# Patient Record
Sex: Female | Born: 1975
Health system: Southern US, Community
[De-identification: ages and names within clinical notes are randomized; demographics above are authoritative.]

## PROBLEM LIST (undated history)

## (undated) DIAGNOSIS — N6019 Diffuse cystic mastopathy of unspecified breast: Secondary | ICD-10-CM

## (undated) DIAGNOSIS — O039 Complete or unspecified spontaneous abortion without complication: Secondary | ICD-10-CM

## (undated) DIAGNOSIS — R569 Unspecified convulsions: Secondary | ICD-10-CM

## (undated) DIAGNOSIS — E785 Hyperlipidemia, unspecified: Secondary | ICD-10-CM

## (undated) DIAGNOSIS — T7840XA Allergy, unspecified, initial encounter: Secondary | ICD-10-CM

## (undated) DIAGNOSIS — R413 Other amnesia: Secondary | ICD-10-CM

## (undated) HISTORY — PX: KNEE ARTHROSCOPY: SUR90

## (undated) HISTORY — DX: Diffuse cystic mastopathy of unspecified breast: N60.19

## (undated) HISTORY — DX: Unspecified convulsions: R56.9

## (undated) HISTORY — DX: Complete or unspecified spontaneous abortion without complication: O03.9

## (undated) HISTORY — DX: Hyperlipidemia, unspecified: E78.5

## (undated) HISTORY — DX: Other amnesia: R41.3

## (undated) HISTORY — DX: Allergy, unspecified, initial encounter: T78.40XA

---

## 1998-05-14 ENCOUNTER — Other Ambulatory Visit: Admission: RE | Admit: 1998-05-14 | Discharge: 1998-05-14 | Payer: Self-pay | Admitting: *Deleted

## 1999-05-22 ENCOUNTER — Other Ambulatory Visit: Admission: RE | Admit: 1999-05-22 | Discharge: 1999-05-22 | Payer: Self-pay | Admitting: *Deleted

## 2000-06-17 ENCOUNTER — Other Ambulatory Visit: Admission: RE | Admit: 2000-06-17 | Discharge: 2000-06-17 | Payer: Self-pay | Admitting: Obstetrics and Gynecology

## 2001-03-13 ENCOUNTER — Inpatient Hospital Stay (HOSPITAL_COMMUNITY): Admission: AD | Admit: 2001-03-13 | Discharge: 2001-03-16 | Payer: Self-pay | Admitting: Obstetrics and Gynecology

## 2001-04-26 ENCOUNTER — Other Ambulatory Visit: Admission: RE | Admit: 2001-04-26 | Discharge: 2001-04-26 | Payer: Self-pay | Admitting: Obstetrics and Gynecology

## 2002-06-13 ENCOUNTER — Other Ambulatory Visit: Admission: RE | Admit: 2002-06-13 | Discharge: 2002-06-13 | Payer: Self-pay | Admitting: Obstetrics and Gynecology

## 2003-08-06 ENCOUNTER — Other Ambulatory Visit: Admission: RE | Admit: 2003-08-06 | Discharge: 2003-08-06 | Payer: Self-pay | Admitting: Obstetrics and Gynecology

## 2004-02-10 DIAGNOSIS — O039 Complete or unspecified spontaneous abortion without complication: Secondary | ICD-10-CM

## 2004-02-10 HISTORY — PX: DILATION AND CURETTAGE OF UTERUS: SHX78

## 2004-02-10 HISTORY — DX: Complete or unspecified spontaneous abortion without complication: O03.9

## 2004-07-17 ENCOUNTER — Other Ambulatory Visit: Admission: RE | Admit: 2004-07-17 | Discharge: 2004-07-17 | Payer: Self-pay | Admitting: Obstetrics and Gynecology

## 2004-07-22 ENCOUNTER — Encounter (INDEPENDENT_AMBULATORY_CARE_PROVIDER_SITE_OTHER): Payer: Self-pay | Admitting: *Deleted

## 2004-07-22 ENCOUNTER — Ambulatory Visit (HOSPITAL_COMMUNITY): Admission: RE | Admit: 2004-07-22 | Discharge: 2004-07-22 | Payer: Self-pay | Admitting: Obstetrics and Gynecology

## 2005-04-24 ENCOUNTER — Other Ambulatory Visit: Admission: RE | Admit: 2005-04-24 | Discharge: 2005-04-24 | Payer: Self-pay | Admitting: Obstetrics and Gynecology

## 2005-10-07 ENCOUNTER — Inpatient Hospital Stay (HOSPITAL_COMMUNITY): Admission: AD | Admit: 2005-10-07 | Discharge: 2005-10-07 | Payer: Self-pay | Admitting: Obstetrics and Gynecology

## 2005-10-27 ENCOUNTER — Inpatient Hospital Stay (HOSPITAL_COMMUNITY): Admission: AD | Admit: 2005-10-27 | Discharge: 2005-10-27 | Payer: Self-pay | Admitting: Obstetrics and Gynecology

## 2005-11-11 ENCOUNTER — Inpatient Hospital Stay (HOSPITAL_COMMUNITY): Admission: RE | Admit: 2005-11-11 | Discharge: 2005-11-13 | Payer: Self-pay | Admitting: Obstetrics and Gynecology

## 2006-08-04 ENCOUNTER — Encounter: Admission: RE | Admit: 2006-08-04 | Discharge: 2006-08-04 | Payer: Self-pay | Admitting: Internal Medicine

## 2008-04-18 ENCOUNTER — Ambulatory Visit: Payer: Self-pay

## 2010-06-27 ENCOUNTER — Other Ambulatory Visit (HOSPITAL_COMMUNITY): Payer: Self-pay | Admitting: Internal Medicine

## 2010-06-27 ENCOUNTER — Ambulatory Visit (HOSPITAL_COMMUNITY)
Admission: RE | Admit: 2010-06-27 | Discharge: 2010-06-27 | Disposition: A | Discharge status: Home / Self Care | Payer: BC Managed Care – PPO | Source: Ambulatory Visit | Attending: Internal Medicine | Admitting: Internal Medicine

## 2010-06-27 DIAGNOSIS — R0989 Other specified symptoms and signs involving the circulatory and respiratory systems: Secondary | ICD-10-CM | POA: Insufficient documentation

## 2010-06-27 DIAGNOSIS — R059 Cough, unspecified: Secondary | ICD-10-CM

## 2010-06-27 DIAGNOSIS — R05 Cough: Secondary | ICD-10-CM

## 2010-06-27 DIAGNOSIS — R52 Pain, unspecified: Secondary | ICD-10-CM

## 2010-06-27 NOTE — Op Note (Signed)
NAMEVENA, BASSINGER              ACCOUNT NO.:  [id number]   MEDICAL RECORD NO.:  [id number]          PATIENT TYPE:  INP   LOCATION:  9102                          FACILITY:  [facility]   PHYSICIAN:  Guy Sandifer. Henderson Cloud, M.D. DATE OF BIRTH:  February 11, 1975   DATE OF PROCEDURE:  11/11/2005  DATE OF DISCHARGE:                                 OPERATIVE REPORT   PROCEDURE:  Vacuum extraction.   INDICATIONS AND CONSENT:  This patient is a 35 year old married white female  G4, P1 with an EDC of 11/20/2005.  She is admitted for size greater than  dates, for induction with a cervix.  She progresses to complete and pushing.  Been pushing for 1-1/2 to 2 hours.  Vertex is +3 and ROT.  She is exhausted.  Requests help.  Options are reviewed.  Vacuum extractor is reviewed: 1 in  40,000 risk of severe morbidity; mortality is reviewed.  All questions were  answered with the patient and husband.  The patient is prepped.  Bladder is  straight catheterized.   The kiwi vacuum extractor is placed.  There is 1 pop-off secondary to a lot  of curly hair on the baby's head.  Over 2-3 contractions with easy pulls,  the vertex delivers.  The remainder of the baby delivers without difficulty.  Viable female infant, Apgars of 9 and 9 at one and five minutes respectively  are obtained.  Cord pH and birth weight is pending at the time of dictation.  Placenta is delivered intact.  Estimated blood loss is 600 mL.  Cervix and  vagina without laceration.  Second-degree midline episiotomy is repaired in  standard fashion.  The patient and infant are stable in labor and delivery  room.      Guy Sandifer Henderson Cloud, M.D.  Electronically Signed     JET/MEDQ  D:  11/11/2005  T:  11/13/2005  Job:  [id number]

## 2010-06-27 NOTE — Op Note (Signed)
NAMEMARJI, KUEHNEL              ACCOUNT NO.:  [id number]   MEDICAL RECORD NO.:  [id number]          PATIENT TYPE:  AMB   LOCATION:  SDC                           FACILITY:  [facility]   PHYSICIAN:  Guy Sandifer. Henderson Cloud, M.D. DATE OF BIRTH:  04/12/1975   DATE OF PROCEDURE:  07/22/2004  DATE OF DISCHARGE:                                 OPERATIVE REPORT   PREOPERATIVE DIAGNOSIS:  Spontaneous abortion.   POSTOPERATIVE DIAGNOSIS:  Spontaneous abortion.   PROCEDURE:  1.  Dilatation and evacuation.  2.  Xylocaine 1% paracervical block.   SURGEON:  Guy Sandifer. Henderson Cloud, M.D.   ANESTHESIA:  MAC.   SPECIMENS:  Products of conception.   INDICATIONS AND CONSENT:  This patient is a 35 year old married white  female, G3, P1, with a spontaneous miscarriage at 7-2/7 weeks' crown-rump  length as documented on ultrasound.  Details are dictated on the history and  physical.  Dilatation and evacuation have been discussed with the patient  preoperatively.  Potential risks and complications are discussed  preoperatively including, but not limited to, infection, uterine  perforation, bowel, bladder or ureteral damage, bleeding requiring  transfusion of blood products and possible transfusions, HIV and hepatitis  acquisition, DVT, PE and pneumonia, intrauterine synechia, secondary  infertility and hysterectomy.  All questions have been answered and consent  is signed and on the chart.   PROCEDURE:  The patient is taken to the operating room where she is  identified, placed in the dorsal supine position and given intravenous  sedation.  She is then placed in the dorsal lithotomy position, where she is  gently prepped, bladder is straight-catheterized and draped in a sterile  fashion.  A bivalved speculum is placed in the vagina and the anterior  cervical lip is injected with 1% Xylocaine and grasped with a single-tooth  tenaculum.  A paracervical block is placed at the 2, 4, 5, 7, 8 and 10  o'clock positions  with approximately 20 mL of 1% plain Xylocaine.  Cervix is  then gently progressively dilated to a 29 dilator.  A #7 curved suction  curette is then placed and suction curettage is carried out for products of  conception.  Ten units of Pitocin are added to the remaining 500 mL of IV  fluids after the initial pass of the suction curet.  Alternating sharp and  suction curettage are carried out until the cavity is clean.  The patient is  given intravenous antibiotics as well.  Good hemostasis is noted.  All  instruments are removed.  The patient is taken to the recovery room in  stable condition.     JET/MEDQ  D:  07/22/2004  T:  07/22/2004  Job:  [id number]

## 2010-06-27 NOTE — H&P (Signed)
Angela Smith, Angela Smith              ACCOUNT NO.:  [id number]   MEDICAL RECORD NO.:  [id number]          PATIENT TYPE:  AMB   LOCATION:  SDC                           FACILITY:  [facility]   PHYSICIAN:  Guy Sandifer. Henderson Cloud, M.D. DATE OF BIRTH:  1975/08/13   DATE OF ADMISSION:  DATE OF DISCHARGE:                                HISTORY & PHYSICAL   CHIEF COMPLAINT:  Miscarriage.   HISTORY OF PRESENT ILLNESS:  This patient is a 35 year old married white  female, G3, P1, who had a spontaneous miscarriage managed expectantly  earlier this year.  She had a documented quantitative hCG that was negative  prior to this conception.  Her last menstrual period was May 08, 2004.  Ultrasound in my office on July 17, 2004 revealed an intrauterine pregnancy  with a crown-rump length consistent with 7 weeks and 2 days.  No heart  activity was noted on prolonged examination.  There was subchorionic  hemorrhage noted.  The cervix is closed, thick, and high.  Blood type is O  positive.  After discussion of the options, she is being admitted for  dilatation and evacuation.   PAST MEDICAL HISTORY:  1.  History of hemorrhoids.  2.  History of anemia.  3.  Seizure disorder as a child.   PAST SURGICAL HISTORY:  ACL repair in 2000.   OBSTETRIC HISTORY:  1.  Vaginal delivery x1.  2.  Miscarriage x1.   MEDICATIONS:  Prenatal vitamins.   ALLERGIES:  No known drug allergies.   SOCIAL HISTORY:  Denies tobacco, alcohol, or drug abuse.   FAMILY HISTORY:  Heart disease in maternal grandfather.   REVIEW OF SYSTEMS:  NEUROLOGIC:  Denies headache.  CARDIAC:  Denies chest  pain.  PULMONARY:  Denies shortness of breath.  GI:  Denies recent changes  in bowel habits.   PHYSICAL EXAMINATION:  VITAL SIGNS:  Height 5 feet, 8.5 inches.  Weight 163  pounds.  Blood pressure 114/70.  HEENT:  Without thyromegaly.  LUNGS:  Clear to auscultation.  HEART:  Regular rate and rhythm.  BACK:  Without CVA tenderness.  BREASTS:   Without masses, retractions, or discharge.  ABDOMEN:  Soft, nontender, without masses.  PELVIC:  Vulva, vagina, cervix without lesions.  Uterus is about 6-8 weeks  in size.  Adnexa nontender without masses.  Cervix closed and thick.  EXTREMITIES:  Neurological exam grossly within normal limits.   LABORATORY DATA:  Blood type is O positive.   ASSESSMENT:  Spontaneous miscarriage.   PLAN:  Dilatation evacuation.       JET/MEDQ  D:  07/21/2004  T:  07/21/2004  Job:  [id number]

## 2011-08-26 ENCOUNTER — Other Ambulatory Visit: Payer: Self-pay | Admitting: Obstetrics and Gynecology

## 2011-08-26 DIAGNOSIS — N6320 Unspecified lump in the left breast, unspecified quadrant: Secondary | ICD-10-CM

## 2011-08-28 ENCOUNTER — Other Ambulatory Visit: Payer: BC Managed Care – PPO

## 2011-09-02 ENCOUNTER — Ambulatory Visit
Admission: RE | Admit: 2011-09-02 | Discharge: 2011-09-02 | Disposition: A | Discharge status: Home / Self Care | Payer: BC Managed Care – PPO | Source: Ambulatory Visit | Attending: Obstetrics and Gynecology | Admitting: Obstetrics and Gynecology

## 2011-09-02 ENCOUNTER — Other Ambulatory Visit: Payer: Self-pay | Admitting: Obstetrics and Gynecology

## 2011-09-02 DIAGNOSIS — N6320 Unspecified lump in the left breast, unspecified quadrant: Secondary | ICD-10-CM

## 2012-11-29 ENCOUNTER — Encounter: Payer: Self-pay | Admitting: Internal Medicine

## 2013-11-28 ENCOUNTER — Ambulatory Visit (INDEPENDENT_AMBULATORY_CARE_PROVIDER_SITE_OTHER): Payer: BC Managed Care – PPO | Admitting: Emergency Medicine

## 2013-11-28 ENCOUNTER — Encounter: Payer: Self-pay | Admitting: Emergency Medicine

## 2013-11-28 VITALS — BP 118/70 | HR 64 | Temp 98.0°F | Resp 16 | Ht 68.5 in | Wt 158.0 lb

## 2013-11-28 DIAGNOSIS — Z0000 Encounter for general adult medical examination without abnormal findings: Secondary | ICD-10-CM

## 2013-11-28 DIAGNOSIS — E559 Vitamin D deficiency, unspecified: Secondary | ICD-10-CM

## 2013-11-28 DIAGNOSIS — Z111 Encounter for screening for respiratory tuberculosis: Secondary | ICD-10-CM

## 2013-11-28 DIAGNOSIS — Z79899 Other long term (current) drug therapy: Secondary | ICD-10-CM

## 2013-11-28 LAB — BASIC METABOLIC PANEL WITH GFR
BUN: 11 mg/dL (ref 6–23)
CALCIUM: 9.6 mg/dL (ref 8.4–10.5)
CO2: 27 meq/L (ref 19–32)
CREATININE: 0.8 mg/dL (ref 0.50–1.10)
Chloride: 103 mEq/L (ref 96–112)
GFR, Est African American: >89 mL/min
GFR, Est Non African American: >89 mL/min
GLUCOSE: 82 mg/dL (ref 70–99)
Potassium: 4.5 mEq/L (ref 3.5–5.3)
Sodium: 139 mEq/L (ref 135–145)

## 2013-11-28 LAB — CBC WITH DIFFERENTIAL/PLATELET
Basophils Absolute: 0 10*3/uL (ref 0.0–0.1)
Basophils Relative: 0 % (ref 0–1)
EOS ABS: 0 10*3/uL (ref 0.0–0.7)
EOS PCT: 0 % (ref 0–5)
HCT: 38.8 % (ref 36.0–46.0)
HEMOGLOBIN: 13.7 g/dL (ref 12.0–15.0)
LYMPHS ABS: 2.2 10*3/uL (ref 0.7–4.0)
Lymphocytes Relative: 31 % (ref 12–46)
MCH: 32.9 pg (ref 26.0–34.0)
MCHC: 35.3 g/dL (ref 30.0–36.0)
MCV: 93.3 fL (ref 78.0–100.0)
MONOS PCT: 7 % (ref 3–12)
Monocytes Absolute: 0.5 10*3/uL (ref 0.1–1.0)
NEUTROS PCT: 62 % (ref 43–77)
Neutro Abs: 4.5 10*3/uL (ref 1.7–7.7)
Platelets: 314 10*3/uL (ref 150–400)
RBC: 4.16 MIL/uL (ref 3.87–5.11)
RDW: 13.1 % (ref 11.5–15.5)
WBC: 7.2 10*3/uL (ref 4.0–10.5)

## 2013-11-28 LAB — HEMOGLOBIN A1C
Hgb A1c MFr Bld: 5.3 % (ref <5.7)
Mean Plasma Glucose: 105 mg/dL (ref <117)

## 2013-11-28 LAB — HEPATIC FUNCTION PANEL
ALBUMIN: 4.6 g/dL (ref 3.5–5.2)
ALT: 17 U/L (ref 0–35)
AST: 22 U/L (ref 0–37)
Alkaline Phosphatase: 53 U/L (ref 39–117)
Bilirubin, Direct: 0.2 mg/dL (ref 0.0–0.3)
Indirect Bilirubin: 0.6 mg/dL (ref 0.2–1.2)
TOTAL PROTEIN: 7.1 g/dL (ref 6.0–8.3)
Total Bilirubin: 0.8 mg/dL (ref 0.2–1.2)

## 2013-11-28 LAB — LIPID PANEL
Cholesterol: 137 mg/dL (ref 0–200)
HDL: 59 mg/dL (ref >39)
LDL Cholesterol: 67 mg/dL (ref 0–99)
TRIGLYCERIDES: 57 mg/dL (ref <150)
Total CHOL/HDL Ratio: 2.3 Ratio
VLDL: 11 mg/dL (ref 0–40)

## 2013-11-28 LAB — TSH: TSH: 1.923 u[IU]/mL (ref 0.350–4.500)

## 2013-11-28 LAB — MAGNESIUM: MAGNESIUM: 1.9 mg/dL (ref 1.5–2.5)

## 2013-11-28 NOTE — Progress Notes (Signed)
Subjective:    Patient ID: Angela Smith, female    DOB: Jun 19, 1975, 38 y.o.   MRN: [id number]  HPI Comments: 38 yo CPE. She is doing well overall. She is on VITA PAK with fish oil/ D/ MVIT. Elliptical/ weights x 6 days / wk for exercise. She has been eating healthy. 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  She is in chiropractic for wrist inflammation. She notes + improvement with treatments and limited ROM exercises.     Medication List       This list is accurate as of: 11/28/13 11:59 PM.  Always use your most recent med list.               aspirin 81 MG tablet  Take 81 mg by mouth daily.     cholecalciferol 1000 UNITS tablet  Commonly known as:  VITAMIN D  Take 2,000 Units by mouth daily.     fish oil-omega-3 fatty acids 1000 MG capsule  Take 2 g by mouth daily.     MIRENA IU  by Intrauterine route. PLACED 2011       Allergies  Allergen Reactions  . Rhinocort [Budesonide] Other (See Comments)    NO RELIEF  . Wellbutrin [Bupropion] Other (See Comments)    SEIZURE RISK     Past Medical History  Diagnosis Date  . Allergy     ALLERGIC RHINITS  . Seizures     INFANTILE  . Hyperlipidemia     DIET/ HERBAL CONTROLED  . Fibrocystic breast disease   . Miscarriage 2006    TWO SEPERATE 4 MONTHS APART   Past Surgical History  Procedure Laterality Date  . Dilation and curettage of uterus  2006    MISCARRIAGE  . Knee arthroscopy Left    History  Substance Use Topics  . Smoking status: Never Smoker   . Smokeless tobacco: Not on file  . Alcohol Use: No   Family History  Problem Relation Age of Onset  . Depression Mother   . Hypertension Father   . Diabetes Maternal Grandmother   . Osteoporosis Maternal Grandmother   . Heart disease Maternal Grandfather   . Hyperlipidemia Maternal Grandfather   . Cancer Maternal Grandfather     PANCREATIC/ BONE  . Cancer Paternal Grandfather     THROAT     MAINTENANCE: Mammo:06/2011 WNL due @ 8 Pap/ Pelvic:5/ 2015 WNL [id number] Dentist:q 6 month  IMMUNIZATIONS: Tdap:2014 Influenza: declines  Patient Care Team: Unk Pinto, MD as PCP - General (Internal Medicine) Allena Katz, MD as Consulting Physician (Obstetrics and Gynecology) Wilburt Finlay, OD as Referring Physician (Optometry) Amy Martinique, MD as Consulting Physician (Dermatology) Bethann Goo, Burna Mortimer)  Review of Systems  Respiratory: Negative for shortness of breath.   Cardiovascular: Negative for chest pain.  Musculoskeletal: Positive for arthralgias.  All other systems reviewed and are negative.  BP 118/70  Pulse 64  Temp(Src) 98 F (36.7 C) (Temporal)  Resp 16  Ht 5' 8.5" (1.74 m)  Wt 158 lb (71.668 kg)  BMI 23.67 kg/m2      Objective:   Physical Exam  Nursing note and vitals reviewed. Constitutional: She is oriented to person, place, and time. She appears well-developed and well-nourished. No distress.  HENT:  Head: Normocephalic and atraumatic.  Right Ear: External ear normal.  Left Ear: External ear normal.  Nose: Nose normal.  Mouth/Throat: Oropharynx is clear and moist.  Eyes: Conjunctivae and EOM are normal. Pupils are equal,  round, and reactive to light. Right eye exhibits no discharge. Left eye exhibits no discharge. No scleral icterus.  Neck: Normal range of motion. Neck supple. No JVD present. No tracheal deviation present. No thyromegaly present.  Cardiovascular: Normal rate, regular rhythm, normal heart sounds and intact distal pulses.   Pulmonary/Chest: Effort normal and breath sounds normal.  Abdominal: Soft. Bowel sounds are normal. She exhibits no distension and no mass. There is no tenderness. There is no rebound and no guarding.  Musculoskeletal: Normal range of motion. She exhibits no edema and no tenderness.  Lymphadenopathy:    She has no cervical adenopathy.  Neurological: She is alert and oriented to person, place, and time. She  has normal reflexes. No cranial nerve deficit. She exhibits normal muscle tone. Coordination normal.  Skin: Skin is warm and dry. No rash noted. No erythema. No pallor.  Psychiatric: She has a normal mood and affect. Her behavior is normal. Judgment and thought content normal.      EKG NSCSPT WNL     Assessment & Plan:  1. CPE- Update screening labs/ History/ Immunizations/ Testing as needed. Advised healthy diet, QD exercise, increase H20 and continue RX/ Vitamins AD.  2. Wrist pain- Continue chiropractic, advise against push ups or exercise with extreme pressure on wrists. F/U if symptoms continue for X-ray. Wear wrist splint when sleeping or working.

## 2013-11-28 NOTE — Patient Instructions (Signed)

## 2013-11-29 LAB — VITAMIN D 25 HYDROXY (VIT D DEFICIENCY, FRACTURES): Vit D, 25-Hydroxy: 67 ng/mL (ref 30–89)

## 2013-11-29 LAB — URINALYSIS, MICROSCOPIC ONLY
BACTERIA UA: NONE SEEN
CRYSTALS: NONE SEEN
Casts: NONE SEEN
Squamous Epithelial / LPF: NONE SEEN

## 2013-11-29 LAB — URINALYSIS, ROUTINE W REFLEX MICROSCOPIC
Bilirubin Urine: NEGATIVE
Glucose, UA: NEGATIVE mg/dL
KETONES UR: NEGATIVE mg/dL
Leukocytes, UA: NEGATIVE
NITRITE: NEGATIVE
PROTEIN: NEGATIVE mg/dL
SPECIFIC GRAVITY, URINE: 1.005 (ref 1.005–1.030)
UROBILINOGEN UA: 0.2 mg/dL (ref 0.0–1.0)
pH: 7 (ref 5.0–8.0)

## 2013-11-29 LAB — INSULIN, FASTING: Insulin fasting, serum: 1.4 u[IU]/mL — ABNORMAL LOW (ref 2.0–19.6)

## 2013-11-30 ENCOUNTER — Encounter: Payer: Self-pay | Admitting: Emergency Medicine

## 2013-12-01 LAB — TB SKIN TEST: TB SKIN TEST: NEGATIVE

## 2013-12-14 ENCOUNTER — Other Ambulatory Visit: Payer: Self-pay | Admitting: Emergency Medicine

## 2013-12-14 ENCOUNTER — Other Ambulatory Visit: Payer: BC Managed Care – PPO

## 2013-12-14 DIAGNOSIS — R319 Hematuria, unspecified: Secondary | ICD-10-CM

## 2013-12-15 LAB — URINALYSIS, ROUTINE W REFLEX MICROSCOPIC
Bilirubin Urine: NEGATIVE
Glucose, UA: NEGATIVE mg/dL
Hgb urine dipstick: NEGATIVE
KETONES UR: NEGATIVE mg/dL
LEUKOCYTES UA: NEGATIVE
NITRITE: NEGATIVE
PH: 7 (ref 5.0–8.0)
Protein, ur: NEGATIVE mg/dL
UROBILINOGEN UA: 0.2 mg/dL (ref 0.0–1.0)

## 2014-04-05 ENCOUNTER — Other Ambulatory Visit: Payer: Self-pay | Admitting: Obstetrics and Gynecology

## 2014-04-06 LAB — CYTOLOGY - PAP

## 2014-12-03 ENCOUNTER — Encounter: Payer: Self-pay | Admitting: Emergency Medicine

## 2015-05-25 ENCOUNTER — Emergency Department (HOSPITAL_COMMUNITY)
Admission: EM | Admit: 2015-05-25 | Discharge: 2015-05-25 | Disposition: A | Discharge status: Home / Self Care | Payer: BLUE CROSS/BLUE SHIELD | Attending: Emergency Medicine | Admitting: Emergency Medicine

## 2015-05-25 ENCOUNTER — Emergency Department (HOSPITAL_COMMUNITY): Payer: BLUE CROSS/BLUE SHIELD

## 2015-05-25 ENCOUNTER — Encounter (HOSPITAL_COMMUNITY): Payer: Self-pay | Admitting: Emergency Medicine

## 2015-05-25 DIAGNOSIS — S61011A Laceration without foreign body of right thumb without damage to nail, initial encounter: Secondary | ICD-10-CM | POA: Insufficient documentation

## 2015-05-25 DIAGNOSIS — Y9389 Activity, other specified: Secondary | ICD-10-CM | POA: Insufficient documentation

## 2015-05-25 DIAGNOSIS — S65211A Laceration of superficial palmar arch of right hand, initial encounter: Secondary | ICD-10-CM | POA: Diagnosis present

## 2015-05-25 DIAGNOSIS — E785 Hyperlipidemia, unspecified: Secondary | ICD-10-CM | POA: Insufficient documentation

## 2015-05-25 DIAGNOSIS — Y929 Unspecified place or not applicable: Secondary | ICD-10-CM | POA: Diagnosis not present

## 2015-05-25 DIAGNOSIS — W228XXA Striking against or struck by other objects, initial encounter: Secondary | ICD-10-CM | POA: Insufficient documentation

## 2015-05-25 DIAGNOSIS — Y999 Unspecified external cause status: Secondary | ICD-10-CM | POA: Insufficient documentation

## 2015-05-25 DIAGNOSIS — S61411A Laceration without foreign body of right hand, initial encounter: Secondary | ICD-10-CM

## 2015-05-25 MED ORDER — LIDOCAINE HCL (PF) 1 % IJ SOLN
5.0000 mL | Freq: Once | INTRAMUSCULAR | Status: DC
Start: 2015-05-25 — End: 2015-05-25
  Filled 2015-05-25: qty 5

## 2015-05-25 NOTE — Discharge Instructions (Signed)
Return for any redness, drainage, increased pain or other problems.  HAVE A HAPPY EASTER!!!  Angela Smith you find all the eggs.

## 2015-05-25 NOTE — ED Provider Notes (Signed)
CSN: [id number]     Arrival date & time 05/25/15  1834 History   First MD Initiated Contact with Patient 05/25/15 1853     Chief Complaint  Patient presents with  . Extremity Laceration     (Consider location/radiation/quality/duration/timing/severity/associated sxs/prior Treatment) Patient is a 40 y.o. female presenting with skin laceration. The history is provided by the patient.  Laceration Location:  Finger Finger laceration location:  R thumb Length (cm):  2 Depth:  Through dermis Quality: straight   Bleeding: controlled   Time since incident:  2 hours Laceration mechanism:  Broken glass Pain details:    Severity:  Mild   Progression:  Improving Worsened by:  Nothing tried Tetanus status:  Up to date  Angela Smith is a 40 y.o. female who presents to the ED with a laceration to the right thumb. Patient states that she broke a plate and it cut her thumb approximately 2 hours prior to arrival to the ED. She is up to date on tetanus.   Past Medical History  Diagnosis Date  . Allergy     ALLERGIC RHINITS  . Seizures ([facility])     INFANTILE  . Hyperlipidemia     DIET/ HERBAL CONTROLED  . Fibrocystic breast disease   . Miscarriage 2006    TWO SEPERATE 4 MONTHS APART   Past Surgical History  Procedure Laterality Date  . Dilation and curettage of uterus  2006    MISCARRIAGE  . Knee arthroscopy Left    Family History  Problem Relation Age of Onset  . Depression Mother   . Hypertension Father   . Diabetes Maternal Grandmother   . Osteoporosis Maternal Grandmother   . Heart disease Maternal Grandfather   . Hyperlipidemia Maternal Grandfather   . Cancer Maternal Grandfather     PANCREATIC/ BONE  . Cancer Paternal Grandfather     THROAT   Social History  Substance Use Topics  . Smoking status: Never Smoker   . Smokeless tobacco: None  . Alcohol Use: No   OB History    No data available     Review of Systems Negative except as stated in HPI   Allergies    Rhinocort  Home Medications   Prior to Admission medications   Medication Sig Start Date End Date Taking? Authorizing Provider  aspirin 81 MG tablet Take 81 mg by mouth daily.    Historical Provider, MD  cholecalciferol (VITAMIN D) 1000 UNITS tablet Take 2,000 Units by mouth daily.    Historical Provider, MD  fish oil-omega-3 fatty acids 1000 MG capsule Take 2 g by mouth daily.    Historical Provider, MD  Levonorgestrel (MIRENA IU) by Intrauterine route. PLACED 2011    Historical Provider, MD   BP 119/87 mmHg  Pulse 70  Temp(Src) 98.2 F (36.8 C) (Oral)  Resp 15  Ht 5\' 9"  (1.753 m)  Wt 74.844 kg  BMI 24.36 kg/m2  SpO2 98% Physical Exam  Constitutional: She is oriented to person, place, and time. She appears well-developed and well-nourished. No distress.  HENT:  Head: Normocephalic and atraumatic.  Eyes: EOM are normal.  Neck: Neck supple.  Cardiovascular: Normal rate.   Pulmonary/Chest: Effort normal.  Musculoskeletal: Normal range of motion.       Right hand: She exhibits tenderness and laceration. She exhibits normal range of motion, normal capillary refill and no swelling. Normal sensation noted. Normal strength noted. She exhibits no thumb/finger opposition.       Hands: Superficial laceration to the dorsum  of the right hand and 2 cm laceration to the right thumb.   Neurological: She is alert and oriented to person, place, and time. No cranial nerve deficit.  Skin: Skin is warm and dry.  Psychiatric: She has a normal mood and affect. Her behavior is normal.  Nursing note and vitals reviewed.   ED Course  .Marland KitchenLaceration Repair Date/Time: 05/25/2015 8:19 PM Performed by: Ashley Murrain Authorized by: Ashley Murrain Consent: Verbal consent obtained. Risks and benefits: risks, benefits and alternatives were discussed Consent given by: patient Patient understanding: patient states understanding of the procedure being performed Imaging studies: imaging studies  available Required items: required blood products, implants, devices, and special equipment available Patient identity confirmed: verbally with patient Body area: upper extremity Location details: right thumb Laceration length: 2 cm Tendon involvement: none Nerve involvement: none Vascular damage: no Anesthesia: local infiltration Local anesthetic: lidocaine 1% without epinephrine Anesthetic total: 2 ml Patient sedated: no Preparation: Patient was prepped and draped in the usual sterile fashion. Irrigation solution: saline Irrigation method: syringe Amount of cleaning: standard Debridement: none Degree of undermining: none Skin closure: 5-0 Prolene Number of sutures: 4 Technique: simple Approximation: close Approximation difficulty: simple Dressing: pressure dressing Patient tolerance: Patient tolerated the procedure well with no immediate complications    Superficial Wound to dorsum of hand cleaned and closed with dermabond.   X-ray, wound care,  Dermabond to the superficial laceration to the dorsum of the hand  Sutures to the thumb.   MDM  40 y.o. female with laceration to the right hand stable for d/c without focal neuro deficits. Discussed with the patient x-ray findings and plan of care and all questioned fully answered. She will return if any problems arise. She will f/u for suture removal in 7 days.   Final diagnoses:  Laceration of thumb, right, initial encounter  Superficial laceration of hand, right, initial encounter       [facility], NP 05/25/15 2024  Forde Dandy, MD 05/25/15 [phone]

## 2015-05-25 NOTE — ED Notes (Signed)
PT stated she dropped a plate on a granite countertop and it broke and hit her tumb and top of right hand x2 hours ago. Bleeding controlled at this time.

## 2016-02-10 HISTORY — PX: APPENDECTOMY: SHX54

## 2016-07-16 LAB — HM PAP SMEAR

## 2016-08-09 ENCOUNTER — Observation Stay (HOSPITAL_COMMUNITY)
Admission: EM | Admit: 2016-08-09 | Discharge: 2016-08-11 | Disposition: A | Discharge status: Home / Self Care | Payer: BLUE CROSS/BLUE SHIELD | Attending: General Surgery | Admitting: General Surgery

## 2016-08-09 ENCOUNTER — Encounter (HOSPITAL_COMMUNITY): Payer: Self-pay | Admitting: Emergency Medicine

## 2016-08-09 ENCOUNTER — Emergency Department (HOSPITAL_COMMUNITY): Payer: BLUE CROSS/BLUE SHIELD

## 2016-08-09 DIAGNOSIS — Z8759 Personal history of other complications of pregnancy, childbirth and the puerperium: Secondary | ICD-10-CM | POA: Diagnosis not present

## 2016-08-09 DIAGNOSIS — R1084 Generalized abdominal pain: Secondary | ICD-10-CM

## 2016-08-09 DIAGNOSIS — K3532 Acute appendicitis with perforation and localized peritonitis, without abscess: Secondary | ICD-10-CM

## 2016-08-09 DIAGNOSIS — R109 Unspecified abdominal pain: Secondary | ICD-10-CM | POA: Diagnosis present

## 2016-08-09 DIAGNOSIS — R1110 Vomiting, unspecified: Secondary | ICD-10-CM | POA: Insufficient documentation

## 2016-08-09 DIAGNOSIS — K352 Acute appendicitis with generalized peritonitis, without abscess: Secondary | ICD-10-CM

## 2016-08-09 LAB — URINALYSIS, ROUTINE W REFLEX MICROSCOPIC
Bilirubin Urine: NEGATIVE
Glucose, UA: NEGATIVE mg/dL
KETONES UR: 20 mg/dL — AB
LEUKOCYTES UA: NEGATIVE
Nitrite: NEGATIVE
Protein, ur: 30 mg/dL — AB
Specific Gravity, Urine: >1.046 — ABNORMAL HIGH (ref 1.005–1.030)
pH: 5 (ref 5.0–8.0)

## 2016-08-09 LAB — COMPREHENSIVE METABOLIC PANEL
ALK PHOS: 77 U/L (ref 38–126)
ALT: 14 U/L (ref 14–54)
AST: 15 U/L (ref 15–41)
Albumin: 3.9 g/dL (ref 3.5–5.0)
Anion gap: 17 — ABNORMAL HIGH (ref 5–15)
BILIRUBIN TOTAL: 0.9 mg/dL (ref 0.3–1.2)
BUN: 42 mg/dL — AB (ref 6–20)
CALCIUM: 9.3 mg/dL (ref 8.9–10.3)
CO2: 24 mmol/L (ref 22–32)
CREATININE: 1.91 mg/dL — AB (ref 0.44–1.00)
Chloride: 95 mmol/L — ABNORMAL LOW (ref 101–111)
GFR calc Af Amer: 37 mL/min — ABNORMAL LOW (ref >60)
GFR, EST NON AFRICAN AMERICAN: 32 mL/min — AB (ref >60)
Glucose, Bld: 112 mg/dL — ABNORMAL HIGH (ref 65–99)
POTASSIUM: 3.1 mmol/L — AB (ref 3.5–5.1)
Sodium: 136 mmol/L (ref 135–145)
TOTAL PROTEIN: 8.5 g/dL — AB (ref 6.5–8.1)

## 2016-08-09 LAB — CBC WITH DIFFERENTIAL/PLATELET
BASOS ABS: 0 10*3/uL (ref 0.0–0.1)
Basophils Relative: 0 %
Eosinophils Absolute: 0 10*3/uL (ref 0.0–0.7)
Eosinophils Relative: 0 %
HEMATOCRIT: 39.5 % (ref 36.0–46.0)
HEMOGLOBIN: 13.4 g/dL (ref 12.0–15.0)
LYMPHS PCT: 3 %
Lymphs Abs: 0.6 10*3/uL — ABNORMAL LOW (ref 0.7–4.0)
MCH: 30.2 pg (ref 26.0–34.0)
MCHC: 33.9 g/dL (ref 30.0–36.0)
MCV: 89 fL (ref 78.0–100.0)
MONO ABS: 1.3 10*3/uL — AB (ref 0.1–1.0)
MONOS PCT: 6 %
NEUTROS ABS: 19.2 10*3/uL — AB (ref 1.7–7.7)
NEUTROS PCT: 91 %
Platelets: 355 10*3/uL (ref 150–400)
RBC: 4.44 MIL/uL (ref 3.87–5.11)
RDW: 14.8 % (ref 11.5–15.5)
WBC: 21.1 10*3/uL — ABNORMAL HIGH (ref 4.0–10.5)

## 2016-08-09 LAB — HCG, QUANTITATIVE, PREGNANCY

## 2016-08-09 LAB — LIPASE, BLOOD: LIPASE: 31 U/L (ref 11–51)

## 2016-08-09 MED ORDER — SODIUM CHLORIDE 0.9 % IV BOLUS (SEPSIS)
1000.0000 mL | Freq: Once | INTRAVENOUS | Status: AC
  Administered 2016-08-09: 1000 mL via INTRAVENOUS

## 2016-08-09 MED ORDER — ONDANSETRON 4 MG PO TBDP: 4.0000 mg | ORAL_TABLET | Freq: Four times a day (QID) | ORAL | Status: DC | PRN

## 2016-08-09 MED ORDER — ENOXAPARIN SODIUM 40 MG/0.4ML ~~LOC~~ SOLN
40.0000 mg | SUBCUTANEOUS | Status: DC
Start: 2016-08-09 — End: 2016-08-11
  Filled 2016-08-09: qty 0.4

## 2016-08-09 MED ORDER — KCL IN DEXTROSE-NACL 40-5-0.45 MEQ/L-%-% IV SOLN
INTRAVENOUS | Status: DC
  Administered 2016-08-09 – 2016-08-10 (×3): via INTRAVENOUS

## 2016-08-09 MED ORDER — SODIUM CHLORIDE 0.9 % IV BOLUS (SEPSIS)
500.0000 mL | Freq: Once | INTRAVENOUS | Status: AC
  Administered 2016-08-09: 500 mL via INTRAVENOUS

## 2016-08-09 MED ORDER — OXYCODONE-ACETAMINOPHEN 5-325 MG PO TABS
1.0000 | ORAL_TABLET | ORAL | Status: DC | PRN
  Administered 2016-08-10: 2 via ORAL
  Administered 2016-08-10: 1 via ORAL
  Administered 2016-08-11 (×3): 2 via ORAL
  Filled 2016-08-09: qty 2
  Filled 2016-08-09: qty 1
  Filled 2016-08-09 (×3): qty 2

## 2016-08-09 MED ORDER — ACETAMINOPHEN 325 MG PO TABS
650.0000 mg | ORAL_TABLET | Freq: Four times a day (QID) | ORAL | Status: DC | PRN
Start: 2016-08-09 — End: 2016-08-11
  Administered 2016-08-10: 650 mg via ORAL
  Filled 2016-08-09: qty 2

## 2016-08-09 MED ORDER — SIMETHICONE 80 MG PO CHEW: 40.0000 mg | CHEWABLE_TABLET | Freq: Four times a day (QID) | ORAL | Status: DC | PRN

## 2016-08-09 MED ORDER — ACETAMINOPHEN 650 MG RE SUPP: 650.0000 mg | Freq: Four times a day (QID) | RECTAL | Status: DC | PRN

## 2016-08-09 MED ORDER — KETOROLAC TROMETHAMINE 30 MG/ML IJ SOLN
30.0000 mg | Freq: Once | INTRAMUSCULAR | Status: AC
  Administered 2016-08-09: 30 mg via INTRAVENOUS
  Filled 2016-08-09: qty 1

## 2016-08-09 MED ORDER — IOPAMIDOL (ISOVUE-300) INJECTION 61%
75.0000 mL | Freq: Once | INTRAVENOUS | Status: AC | PRN
  Administered 2016-08-09: 75 mL via INTRAVENOUS

## 2016-08-09 MED ORDER — PANTOPRAZOLE SODIUM 40 MG IV SOLR
40.0000 mg | Freq: Every day | INTRAVENOUS | Status: DC
  Administered 2016-08-09 – 2016-08-10 (×2): 40 mg via INTRAVENOUS
  Filled 2016-08-09 (×2): qty 40

## 2016-08-09 MED ORDER — HYDROMORPHONE HCL 1 MG/ML IJ SOLN
1.0000 mg | INTRAMUSCULAR | Status: DC | PRN
  Administered 2016-08-09: 1 mg via INTRAVENOUS
  Filled 2016-08-09: qty 1

## 2016-08-09 MED ORDER — ONDANSETRON HCL 4 MG/2ML IJ SOLN
4.0000 mg | Freq: Four times a day (QID) | INTRAMUSCULAR | Status: DC | PRN
  Administered 2016-08-09: 4 mg via INTRAVENOUS
  Filled 2016-08-09: qty 2

## 2016-08-09 MED ORDER — POTASSIUM CHLORIDE 10 MEQ/100ML IV SOLN
10.0000 meq | INTRAVENOUS | Status: AC
  Administered 2016-08-09 (×3): 10 meq via INTRAVENOUS
  Filled 2016-08-09 (×2): qty 100

## 2016-08-09 MED ORDER — ONDANSETRON HCL 4 MG/2ML IJ SOLN
4.0000 mg | Freq: Once | INTRAMUSCULAR | Status: AC
  Administered 2016-08-09: 4 mg via INTRAVENOUS
  Filled 2016-08-09: qty 2

## 2016-08-09 MED ORDER — PIPERACILLIN-TAZOBACTAM 3.375 G IVPB
3.3750 g | Freq: Three times a day (TID) | INTRAVENOUS | Status: DC
  Administered 2016-08-09 – 2016-08-11 (×5): 3.375 g via INTRAVENOUS
  Filled 2016-08-09 (×5): qty 50

## 2016-08-09 NOTE — ED Triage Notes (Signed)
Pt reports abd pain and n/v/d beginning on Tuesday while on vacation. Has not kept anything down since yesterday.

## 2016-08-09 NOTE — ED Provider Notes (Signed)
[facility] DEPT Provider Note   CSN: [id number] Arrival date & time: 08/09/16  1524     History   Chief Complaint Chief Complaint  Patient presents with  . Abdominal Pain  . Emesis    HPI Angela Smith is a 41 y.o. female.  Patient states that she started with some abdominal cramping on Tuesday and started with diarrhea on Wednesday and vomiting on Thursday.   all 3 has continued throughout the weekend.      The history is provided by the patient. No language interpreter was used.  Abdominal Pain   This is a new problem. The current episode started more than 2 days ago. The problem occurs constantly. The problem has not changed since onset.The pain is associated with an unknown factor. The pain is located in the RLQ and LLQ. The quality of the pain is aching. The pain is at a severity of 6/10. The pain is moderate. Associated symptoms include diarrhea and vomiting. Pertinent negatives include frequency, hematuria and headaches. Nothing aggravates the symptoms.  Emesis   Associated symptoms include abdominal pain and diarrhea. Pertinent negatives include no cough and no headaches.    Past Medical History:  Diagnosis Date  . Allergy    ALLERGIC RHINITS  . Fibrocystic breast disease   . Hyperlipidemia    DIET/ HERBAL CONTROLED  . Miscarriage 2006   TWO SEPERATE 4 MONTHS APART  . Seizures ([place])    INFANTILE    There are no active problems to display for this patient.   Past Surgical History:  Procedure Laterality Date  . DILATION AND CURETTAGE OF UTERUS  2006   MISCARRIAGE  . KNEE ARTHROSCOPY Left     OB History    No data available       Home Medications    Prior to Admission medications   Medication Sig Start Date End Date Taking? Authorizing Provider  ibuprofen (ADVIL,MOTRIN) 200 MG tablet Take 400 mg by mouth every 6 (six) hours as needed for headache, moderate pain or cramping.   Yes [provider]  Levonorgestrel (MIRENA IU) by  Intrauterine route. PLACED 2011   Yes [provider]    Family History Family History  Problem Relation Age of Onset  . Depression Mother   . Hypertension Father   . Diabetes Maternal Grandmother   . Osteoporosis Maternal Grandmother   . Heart disease Maternal Grandfather   . Hyperlipidemia Maternal Grandfather   . Cancer Maternal Grandfather        PANCREATIC/ BONE  . Cancer Paternal Grandfather        THROAT    Social History Social History  Substance Use Topics  . Smoking status: Never Smoker  . Smokeless tobacco: Not on file  . Alcohol use No     Allergies   Rhinocort [budesonide]   Review of Systems Review of Systems  Constitutional: Negative for appetite change and fatigue.  HENT: Negative for congestion, ear discharge and sinus pressure.   Eyes: Negative for discharge.  Respiratory: Negative for cough.   Cardiovascular: Negative for chest pain.  Gastrointestinal: Positive for abdominal pain, diarrhea and vomiting.  Genitourinary: Negative for frequency and hematuria.  Musculoskeletal: Negative for back pain.  Skin: Negative for rash.  Neurological: Negative for seizures and headaches.  Psychiatric/Behavioral: Negative for hallucinations.     Physical Exam Updated Vital Signs BP 101/68   Pulse 91   Temp 98.2 F (36.8 C) (Oral)   Resp 18   Ht 5\' 9"  (1.753 m)  Wt 76.2 kg (168 lb)   SpO2 100%   BMI 24.81 kg/m   Physical Exam  Constitutional: She is oriented to person, place, and time. She appears well-developed.  HENT:  Head: Normocephalic.  Eyes: Conjunctivae and EOM are normal. No scleral icterus.  Neck: Neck supple. No thyromegaly present.  Cardiovascular: Normal rate and regular rhythm.  Exam reveals no gallop and no friction rub.   No murmur heard. Pulmonary/Chest: No stridor. She has no wheezes. She has no rales. She exhibits no tenderness.  Abdominal: She exhibits no distension. There is tenderness. There is no rebound.    Tender rlq and llq  Musculoskeletal: Normal range of motion. She exhibits no edema.  Lymphadenopathy:    She has no cervical adenopathy.  Neurological: She is oriented to person, place, and time. She exhibits normal muscle tone. Coordination normal.  Skin: No rash noted. No erythema.  Psychiatric: She has a normal mood and affect. Her behavior is normal.     ED Treatments / Results  Labs (all labs ordered are listed, but only abnormal results are displayed) Labs Reviewed  CBC WITH DIFFERENTIAL/PLATELET - Abnormal; Notable for the following:       Result Value   WBC 21.1 (*)    Neutro Abs 19.2 (*)    Lymphs Abs 0.6 (*)    Monocytes Absolute 1.3 (*)    All other components within normal limits  COMPREHENSIVE METABOLIC PANEL - Abnormal; Notable for the following:    Potassium 3.1 (*)    Chloride 95 (*)    Glucose, Bld 112 (*)    BUN 42 (*)    Creatinine, Ser 1.91 (*)    Total Protein 8.5 (*)    GFR calc non Af Amer 32 (*)    GFR calc Af Amer 37 (*)    Anion gap 17 (*)    All other components within normal limits  LIPASE, BLOOD  URINALYSIS, ROUTINE W REFLEX MICROSCOPIC  HCG, QUANTITATIVE, PREGNANCY  I-STAT BETA HCG BLOOD, ED ([facility], [facility], [facility] ONLY)    EKG  EKG Interpretation None       Radiology Ct Abdomen Pelvis W Contrast  Result Date: 08/09/2016 CLINICAL DATA:  Pain with nausea, vomiting, and diarrhea for 5 days. EXAM: CT ABDOMEN AND PELVIS WITH CONTRAST TECHNIQUE: Multidetector CT imaging of the abdomen and pelvis was performed using the standard protocol following bolus administration of intravenous contrast. CONTRAST:  24mL ISOVUE-300 IOPAMIDOL (ISOVUE-300) INJECTION 61% COMPARISON:  None. FINDINGS: Lower chest: No acute abnormality. Hepatobiliary: No focal liver abnormality is seen. No gallstones, gallbladder wall thickening, or biliary dilatation. Pancreas: Unremarkable. No pancreatic ductal dilatation or surrounding inflammatory changes. Spleen: Normal in size without  focal abnormality. Adrenals/Urinary Tract: Adrenal glands are unremarkable. Kidneys are normal, without renal calculi, focal lesion, or hydronephrosis. Bladder is unremarkable. Stomach/Bowel: The stomach is normal in appearance. The small bowel is dilated along much of its length with a loop measuring 4.1 cm in the left upper quadrant image 28 of series 2. Dilated loops extend into the pelvis. The distal most aspect of the ileum is relatively decompressed. No discrete transition point is identified. The colon is relatively decompressed as well. There is an appendicolith at the origin of the appendix. The appendix is dilated and inflamed with adjacent stranding. A small amount of adjacent fluid appears be relatively free rather than loculated. An appendicolith is seen at the distal tip of the appendix is well. There is likely secondary inflammation of the transverse colon as it  crosses superior to the inflamed appendix Vascular/Lymphatic: No significant vascular findings are present. No enlarged abdominal or pelvic lymph nodes. Reproductive: An IUD is seen in the uterus. There is probably a dominant follicle in the right ovary. The left ovary is normal. Other: There is fluid in the pelvis. A pocket of fluid to the left on series 2, image 79 measures 10 x 2.9 cm. A smaller pocket posteriorly on the right measures 4.5 by 2.5 cm. Musculoskeletal: No acute or significant osseous findings. IMPRESSION: 1. Appendicitis. No adjacent free air to suggest perforation. A small amount of adjacent fluid appears relatively non loculated with no enhancing wall. Fluid in the pelvis with the largest collection measuring 10 x 2.9 cm could be reactive sterile fluid. However, developing abscesses are not excluded. 2. Small bowel dilatation extending into the pelvis. While a small bowel obstruction is not excluded, the dilatation could represent a small bowel ileus secondary to the appendicitis. Recommend clinical correlation. Findings  called to Dr. Roderic Palau. Electronically Signed   By: Dorise Bullion III M.D   On: 08/09/2016 18:12    Procedures Procedures (including critical care time)  Medications Ordered in ED Medications  sodium chloride 0.9 % bolus 1,000 mL (0 mLs Intravenous Stopped 08/09/16 1713)  ondansetron (ZOFRAN) injection 4 mg (4 mg Intravenous Given 08/09/16 1558)  ketorolac (TORADOL) 30 MG/ML injection 30 mg (30 mg Intravenous Given 08/09/16 1558)  sodium chloride 0.9 % bolus 1,000 mL (1,000 mLs Intravenous New Bag/Given 08/09/16 1713)  iopamidol (ISOVUE-300) 61 % injection 75 mL (75 mLs Intravenous Contrast Given 08/09/16 1716)     Initial Impression / Assessment and Plan / ED Course  I have reviewed the triage vital signs and the nursing notes.  Pertinent labs & imaging results that were available during my care of the patient were reviewed by me and considered in my medical decision making (see chart for details).     Patient with appendicitis. General surgery to admit patient  Final Clinical Impressions(s) / ED Diagnoses   Final diagnoses:  Acute appendicitis with generalized peritonitis    New Prescriptions New Prescriptions   No medications on file     Milton Ferguson, MD 08/09/16 1912

## 2016-08-09 NOTE — ED Notes (Signed)
From CT 

## 2016-08-09 NOTE — ED Notes (Signed)
Pt reports feeling better

## 2016-08-09 NOTE — ED Notes (Signed)
Report to Allison, RN

## 2016-08-09 NOTE — ED Notes (Signed)
Went to [employer] in [place], ate shrimp and linguini and felt sick within an hour. Now, three day history of N/V/D and [facility]

## 2016-08-09 NOTE — H&P (Signed)
Angela Smith is an 41 y.o. female.   Chief Complaint: Abdominal pain, diarrhea, appendicitis HPI: Patient is a 41 year old white female who was vacationing at [place] in [place] who started 5 days to go to have nonspecific abdominal pain. She states that she felt bloated and gassy with the abdominal pain which was intermittent in nature. It was along the upper side of her abdomen. This waxed and waned for the next 24-48 hours, when she then began experiencing diarrhea and loose stools. She also had some nausea with occasional vomiting. Her pain was somewhat supraumbilical nature, but ended up being in the left lower portion of her abdomen. The diarrhea and generalized malaise persisted. She now states the pain is primarily along the lower aspect of her abdomen, left greater than right. She feels weak and dehydrated. She just flew back from [place] today. CT scan of the abdomen showed a large 10 cm fluid collection in the left lower pelvis with a smaller collection in the right lower pelvis, in addition to multiple dilated loops of bowel and appendix with appendicoliths in the distal portion. Mild stranding was also noted. Patient was referred to my care by the emergency room, Dr. Roderic Palau.  Past Medical History:  Diagnosis Date  . Allergy    ALLERGIC RHINITS  . Fibrocystic breast disease   . Hyperlipidemia    DIET/ HERBAL CONTROLED  . Miscarriage 2006   TWO SEPERATE 4 MONTHS APART  . Seizures ([place])    INFANTILE    Past Surgical History:  Procedure Laterality Date  . DILATION AND CURETTAGE OF UTERUS  2006   MISCARRIAGE  . KNEE ARTHROSCOPY Left     Family History  Problem Relation Age of Onset  . Depression Mother   . Hypertension Father   . Diabetes Maternal Grandmother   . Osteoporosis Maternal Grandmother   . Heart disease Maternal Grandfather   . Hyperlipidemia Maternal Grandfather   . Cancer Maternal Grandfather        PANCREATIC/ BONE  . Cancer Paternal Grandfather        THROAT    Social History:  reports that she has never smoked. She does not have any smokeless tobacco history on file. She reports that she does not drink alcohol or use drugs.  Allergies:  Allergies  Allergen Reactions  . Rhinocort [Budesonide] Palpitations    Heart palpitations     (Not in a hospital admission)  Results for orders placed or performed during the hospital encounter of 08/09/16 (from the past 48 hour(s))  CBC with Differential/Platelet     Status: Abnormal   Collection Time: 08/09/16  3:46 PM  Result Value Ref Range   WBC 21.1 (H) 4.0 - 10.5 K/uL   RBC 4.44 3.87 - 5.11 MIL/uL   Hemoglobin 13.4 12.0 - 15.0 g/dL   HCT 39.5 36.0 - 46.0 %   MCV 89.0 78.0 - 100.0 fL   MCH 30.2 26.0 - 34.0 pg   MCHC 33.9 30.0 - 36.0 g/dL   RDW 14.8 11.5 - 15.5 %   Platelets 355 150 - 400 K/uL   Neutrophils Relative % 91 %   Neutro Abs 19.2 (H) 1.7 - 7.7 K/uL   Lymphocytes Relative 3 %   Lymphs Abs 0.6 (L) 0.7 - 4.0 K/uL   Monocytes Relative 6 %   Monocytes Absolute 1.3 (H) 0.1 - 1.0 K/uL   Eosinophils Relative 0 %   Eosinophils Absolute 0.0 0.0 - 0.7 K/uL   Basophils Relative 0 %  Basophils Absolute 0.0 0.0 - 0.1 K/uL  Comprehensive metabolic panel     Status: Abnormal   Collection Time: 08/09/16  3:46 PM  Result Value Ref Range   Sodium 136 135 - 145 mmol/L   Potassium 3.1 (L) 3.5 - 5.1 mmol/L   Chloride 95 (L) 101 - 111 mmol/L   CO2 24 22 - 32 mmol/L   Glucose, Bld 112 (H) 65 - 99 mg/dL   BUN 42 (H) 6 - 20 mg/dL   Creatinine, Ser 1.91 (H) 0.44 - 1.00 mg/dL   Calcium 9.3 8.9 - 10.3 mg/dL   Total Protein 8.5 (H) 6.5 - 8.1 g/dL   Albumin 3.9 3.5 - 5.0 g/dL   AST 15 15 - 41 U/L   ALT 14 14 - 54 U/L   Alkaline Phosphatase 77 38 - 126 U/L   Total Bilirubin 0.9 0.3 - 1.2 mg/dL   GFR calc non Af Amer 32 (L) >60 mL/min   GFR calc Af Amer 37 (L) >60 mL/min    Comment: (NOTE) The eGFR has been calculated using the CKD EPI equation. This calculation has not been validated in all  clinical situations. eGFR's persistently <60 mL/min signify possible Chronic Kidney Disease.    Anion gap 17 (H) 5 - 15  Lipase, blood     Status: None   Collection Time: 08/09/16  3:46 PM  Result Value Ref Range   Lipase 31 11 - 51 U/L  hCG, quantitative, pregnancy     Status: None   Collection Time: 08/09/16  3:46 PM  Result Value Ref Range   hCG, Beta Chain, Quant, S <1 <5 mIU/mL    Comment:          GEST. AGE      CONC.  (mIU/mL)   <=1 WEEK        5 - 50     2 WEEKS       50 - 500     3 WEEKS       100 - 10,000     4 WEEKS     1,000 - 30,000     5 WEEKS     3,500 - 115,000   6-8 WEEKS     12,000 - 270,000    12 WEEKS     15,000 - 220,000        FEMALE AND NON-PREGNANT FEMALE:     LESS THAN 5 mIU/mL    Ct Abdomen Pelvis W Contrast  Result Date: 08/09/2016 CLINICAL DATA:  Pain with nausea, vomiting, and diarrhea for 5 days. EXAM: CT ABDOMEN AND PELVIS WITH CONTRAST TECHNIQUE: Multidetector CT imaging of the abdomen and pelvis was performed using the standard protocol following bolus administration of intravenous contrast. CONTRAST:  49m ISOVUE-300 IOPAMIDOL (ISOVUE-300) INJECTION 61% COMPARISON:  None. FINDINGS: Lower chest: No acute abnormality. Hepatobiliary: No focal liver abnormality is seen. No gallstones, gallbladder wall thickening, or biliary dilatation. Pancreas: Unremarkable. No pancreatic ductal dilatation or surrounding inflammatory changes. Spleen: Normal in size without focal abnormality. Adrenals/Urinary Tract: Adrenal glands are unremarkable. Kidneys are normal, without renal calculi, focal lesion, or hydronephrosis. Bladder is unremarkable. Stomach/Bowel: The stomach is normal in appearance. The small bowel is dilated along much of its length with a loop measuring 4.1 cm in the left upper quadrant image 28 of series 2. Dilated loops extend into the pelvis. The distal most aspect of the ileum is relatively decompressed. No discrete transition point is identified. The  colon is relatively decompressed as well.  There is an appendicolith at the origin of the appendix. The appendix is dilated and inflamed with adjacent stranding. A small amount of adjacent fluid appears be relatively free rather than loculated. An appendicolith is seen at the distal tip of the appendix is well. There is likely secondary inflammation of the transverse colon as it crosses superior to the inflamed appendix Vascular/Lymphatic: No significant vascular findings are present. No enlarged abdominal or pelvic lymph nodes. Reproductive: An IUD is seen in the uterus. There is probably a dominant follicle in the right ovary. The left ovary is normal. Other: There is fluid in the pelvis. A pocket of fluid to the left on series 2, image 79 measures 10 x 2.9 cm. A smaller pocket posteriorly on the right measures 4.5 by 2.5 cm. Musculoskeletal: No acute or significant osseous findings. IMPRESSION: 1. Appendicitis. No adjacent free air to suggest perforation. A small amount of adjacent fluid appears relatively non loculated with no enhancing wall. Fluid in the pelvis with the largest collection measuring 10 x 2.9 cm could be reactive sterile fluid. However, developing abscesses are not excluded. 2. Small bowel dilatation extending into the pelvis. While a small bowel obstruction is not excluded, the dilatation could represent a small bowel ileus secondary to the appendicitis. Recommend clinical correlation. Findings called to Dr. Roderic Palau. Electronically Signed   By: Dorise Bullion III M.D   On: 08/09/2016 18:12    Review of Systems  Constitutional: Positive for malaise/fatigue.  HENT: Negative.   Eyes: Negative.   Respiratory: Negative.   Cardiovascular: Negative.   Gastrointestinal: Positive for abdominal pain, diarrhea, nausea and vomiting.  Genitourinary: Negative.   Musculoskeletal: Positive for back pain.  Skin: Negative.   Neurological: Positive for weakness.  Endo/Heme/Allergies: Negative.    Psychiatric/Behavioral: Negative.     Blood pressure 101/68, pulse 91, temperature 98.2 F (36.8 C), temperature source Oral, resp. rate 18, height 5' 9"  (1.753 m), weight 168 lb (76.2 kg), SpO2 100 %. Physical Exam  Vitals reviewed. Constitutional: She is oriented to person, place, and time. She appears well-developed and well-nourished. No distress.  Looks fatigued.  HENT:  Head: Normocephalic and atraumatic.  Neck: Normal range of motion. Neck supple.  Cardiovascular: Normal rate, regular rhythm and normal heart sounds.   No murmur heard. Respiratory: Effort normal and breath sounds normal. She has no wheezes. She has no rales.  GI: Soft. She exhibits distension. There is tenderness. There is no rebound and no guarding.  Nonspecific tenderness with mild guarding noted throughout, but no rigidity noted. Bowel sounds are decreased. She has no specific McBurney's point tenderness in the right lower quadrant.  Neurological: She is alert and oriented to person, place, and time.  Skin: Skin is warm and dry.    CT scan images personally reviewed Assessment/Plan Impression: Abdominal pain with diarrhea. Patient also noted to have possible appendicitis with appendicoliths. She does have a significant leukocytosis. The presentation is somewhat atypical for appendicitis. Plan: Will admit the patient to the hospital for hydration, treatment of her hypokalemia, nausea control, and pain control. Will reevaluate in a.m., though there is a strong possibility that she will require a diagnostic laparoscopy with appendectomy.  Aviva Signs, MD 08/09/2016, 7:46 PM

## 2016-08-10 ENCOUNTER — Observation Stay (HOSPITAL_COMMUNITY): Payer: BLUE CROSS/BLUE SHIELD | Admitting: Anesthesiology

## 2016-08-10 ENCOUNTER — Encounter (HOSPITAL_COMMUNITY)
Admission: EM | Disposition: A | Discharge status: Home / Self Care | Payer: Self-pay | Source: Home / Self Care | Attending: Emergency Medicine

## 2016-08-10 ENCOUNTER — Encounter (HOSPITAL_COMMUNITY): Payer: Self-pay

## 2016-08-10 DIAGNOSIS — K352 Acute appendicitis with generalized peritonitis: Secondary | ICD-10-CM | POA: Diagnosis not present

## 2016-08-10 DIAGNOSIS — Z8759 Personal history of other complications of pregnancy, childbirth and the puerperium: Secondary | ICD-10-CM | POA: Diagnosis not present

## 2016-08-10 DIAGNOSIS — K3532 Acute appendicitis with perforation and localized peritonitis, without abscess: Secondary | ICD-10-CM

## 2016-08-10 DIAGNOSIS — R1110 Vomiting, unspecified: Secondary | ICD-10-CM | POA: Diagnosis not present

## 2016-08-10 HISTORY — PX: LAPAROSCOPIC APPENDECTOMY: SHX408

## 2016-08-10 LAB — CBC
HEMATOCRIT: 31.6 % — AB (ref 36.0–46.0)
HEMOGLOBIN: 10.9 g/dL — AB (ref 12.0–15.0)
MCH: 31 pg (ref 26.0–34.0)
MCHC: 34.5 g/dL (ref 30.0–36.0)
MCV: 89.8 fL (ref 78.0–100.0)
Platelets: 290 10*3/uL (ref 150–400)
RBC: 3.52 MIL/uL — ABNORMAL LOW (ref 3.87–5.11)
RDW: 15.2 % (ref 11.5–15.5)
WBC: 14.9 10*3/uL — ABNORMAL HIGH (ref 4.0–10.5)

## 2016-08-10 LAB — BASIC METABOLIC PANEL
Anion gap: 6 (ref 5–15)
BUN: 27 mg/dL — ABNORMAL HIGH (ref 6–20)
CHLORIDE: 103 mmol/L (ref 101–111)
CO2: 25 mmol/L (ref 22–32)
CREATININE: 0.98 mg/dL (ref 0.44–1.00)
Calcium: 7.7 mg/dL — ABNORMAL LOW (ref 8.9–10.3)
GFR calc non Af Amer: >60 mL/min (ref >60)
Glucose, Bld: 114 mg/dL — ABNORMAL HIGH (ref 65–99)
Potassium: 3.6 mmol/L (ref 3.5–5.1)
Sodium: 134 mmol/L — ABNORMAL LOW (ref 135–145)

## 2016-08-10 LAB — SURGICAL PCR SCREEN
MRSA, PCR: NEGATIVE
STAPHYLOCOCCUS AUREUS: NEGATIVE

## 2016-08-10 SURGERY — APPENDECTOMY, LAPAROSCOPIC
Anesthesia: General

## 2016-08-10 MED ORDER — CHLORHEXIDINE GLUCONATE CLOTH 2 % EX PADS: 6.0000 | MEDICATED_PAD | Freq: Once | CUTANEOUS | Status: DC

## 2016-08-10 MED ORDER — FENTANYL CITRATE (PF) 100 MCG/2ML IJ SOLN
INTRAMUSCULAR | Status: AC
  Filled 2016-08-10: qty 2

## 2016-08-10 MED ORDER — ONDANSETRON HCL 4 MG/2ML IJ SOLN
4.0000 mg | Freq: Once | INTRAMUSCULAR | Status: AC
  Administered 2016-08-10: 4 mg via INTRAVENOUS

## 2016-08-10 MED ORDER — KETOROLAC TROMETHAMINE 30 MG/ML IJ SOLN
30.0000 mg | Freq: Once | INTRAMUSCULAR | Status: AC
  Administered 2016-08-10: 30 mg via INTRAVENOUS

## 2016-08-10 MED ORDER — GLYCOPYRROLATE 0.2 MG/ML IJ SOLN
INTRAMUSCULAR | Status: DC | PRN
  Administered 2016-08-10: 0.4 mg via INTRAVENOUS

## 2016-08-10 MED ORDER — FENTANYL CITRATE (PF) 100 MCG/2ML IJ SOLN
25.0000 ug | INTRAMUSCULAR | Status: DC | PRN
  Administered 2016-08-10: 50 ug via INTRAVENOUS

## 2016-08-10 MED ORDER — ROCURONIUM BROMIDE 100 MG/10ML IV SOLN
INTRAVENOUS | Status: DC | PRN
  Administered 2016-08-10: 30 mg via INTRAVENOUS
  Administered 2016-08-10: 10 mg via INTRAVENOUS

## 2016-08-10 MED ORDER — FENTANYL CITRATE (PF) 100 MCG/2ML IJ SOLN
INTRAMUSCULAR | Status: DC | PRN
  Administered 2016-08-10: 50 ug via INTRAVENOUS
  Administered 2016-08-10: 100 ug via INTRAVENOUS
  Administered 2016-08-10 (×2): 50 ug via INTRAVENOUS

## 2016-08-10 MED ORDER — SODIUM CHLORIDE 0.9 % IR SOLN
Status: DC | PRN
  Administered 2016-08-10: 3000 mL

## 2016-08-10 MED ORDER — BACITRACIN ZINC 500 UNIT/GM EX OINT
TOPICAL_OINTMENT | CUTANEOUS | Status: AC
  Filled 2016-08-10: qty 0.9

## 2016-08-10 MED ORDER — DIPHENHYDRAMINE HCL 25 MG PO CAPS: 25.0000 mg | ORAL_CAPSULE | Freq: Four times a day (QID) | ORAL | Status: DC | PRN

## 2016-08-10 MED ORDER — PROPOFOL 10 MG/ML IV BOLUS
INTRAVENOUS | Status: DC | PRN
  Administered 2016-08-10: 170 mg via INTRAVENOUS

## 2016-08-10 MED ORDER — MORPHINE SULFATE (PF) 2 MG/ML IV SOLN
2.0000 mg | INTRAVENOUS | Status: DC | PRN
  Administered 2016-08-10: 2 mg via INTRAVENOUS
  Filled 2016-08-10: qty 1

## 2016-08-10 MED ORDER — CHLORHEXIDINE GLUCONATE CLOTH 2 % EX PADS
6.0000 | MEDICATED_PAD | Freq: Once | CUTANEOUS | Status: AC
  Administered 2016-08-10: 6 via TOPICAL

## 2016-08-10 MED ORDER — SUCCINYLCHOLINE CHLORIDE 20 MG/ML IJ SOLN
INTRAMUSCULAR | Status: DC | PRN
  Administered 2016-08-10: 120 mg via INTRAVENOUS

## 2016-08-10 MED ORDER — GLYCOPYRROLATE 0.2 MG/ML IJ SOLN
INTRAMUSCULAR | Status: AC
  Filled 2016-08-10: qty 5

## 2016-08-10 MED ORDER — KETOROLAC TROMETHAMINE 30 MG/ML IJ SOLN
INTRAMUSCULAR | Status: AC
  Filled 2016-08-10: qty 1

## 2016-08-10 MED ORDER — NEOSTIGMINE METHYLSULFATE 10 MG/10ML IV SOLN
INTRAVENOUS | Status: DC | PRN
  Administered 2016-08-10: 3 mg via INTRAVENOUS

## 2016-08-10 MED ORDER — ONDANSETRON HCL 4 MG/2ML IJ SOLN
INTRAMUSCULAR | Status: AC
  Filled 2016-08-10: qty 2

## 2016-08-10 MED ORDER — DIPHENHYDRAMINE HCL 50 MG/ML IJ SOLN: 25.0000 mg | Freq: Four times a day (QID) | INTRAMUSCULAR | Status: DC | PRN

## 2016-08-10 MED ORDER — NEOSTIGMINE METHYLSULFATE 10 MG/10ML IV SOLN
INTRAVENOUS | Status: AC
  Filled 2016-08-10: qty 1

## 2016-08-10 MED ORDER — POVIDONE-IODINE 10 % OINT PACKET
TOPICAL_OINTMENT | CUTANEOUS | Status: DC | PRN
  Administered 2016-08-10: 1 via TOPICAL

## 2016-08-10 MED ORDER — MIDAZOLAM HCL 2 MG/2ML IJ SOLN
INTRAMUSCULAR | Status: AC
  Filled 2016-08-10: qty 2

## 2016-08-10 MED ORDER — POVIDONE-IODINE 10 % EX OINT
TOPICAL_OINTMENT | CUTANEOUS | Status: AC
  Filled 2016-08-10: qty 1

## 2016-08-10 MED ORDER — SIMETHICONE 80 MG PO CHEW: 40.0000 mg | CHEWABLE_TABLET | Freq: Four times a day (QID) | ORAL | Status: DC | PRN

## 2016-08-10 MED ORDER — LACTATED RINGERS IV SOLN
INTRAVENOUS | Status: DC
  Administered 2016-08-10: 14:00:00 via INTRAVENOUS

## 2016-08-10 MED ORDER — MIDAZOLAM HCL 2 MG/2ML IJ SOLN
1.0000 mg | INTRAMUSCULAR | Status: DC
  Administered 2016-08-10: 2 mg via INTRAVENOUS

## 2016-08-10 MED ORDER — LIDOCAINE HCL (CARDIAC) 20 MG/ML IV SOLN
INTRAVENOUS | Status: DC | PRN
  Administered 2016-08-10: 50 mg via INTRATRACHEAL

## 2016-08-10 MED ORDER — FENTANYL CITRATE (PF) 250 MCG/5ML IJ SOLN
INTRAMUSCULAR | Status: AC
  Filled 2016-08-10: qty 5

## 2016-08-10 MED ORDER — PROPOFOL 10 MG/ML IV BOLUS
INTRAVENOUS | Status: AC
  Filled 2016-08-10: qty 20

## 2016-08-10 MED ORDER — BUPIVACAINE HCL (PF) 0.5 % IJ SOLN
INTRAMUSCULAR | Status: DC | PRN
  Administered 2016-08-10: 20 mL

## 2016-08-10 MED ORDER — LACTATED RINGERS IV SOLN
INTRAVENOUS | Status: DC
  Administered 2016-08-10: 18:00:00 via INTRAVENOUS

## 2016-08-10 MED ORDER — BUPIVACAINE HCL (PF) 0.5 % IJ SOLN
INTRAMUSCULAR | Status: AC
  Filled 2016-08-10: qty 30

## 2016-08-10 SURGICAL SUPPLY — 52 items
BAG HAMPER (MISCELLANEOUS) ×2 IMPLANT
BAG RETRIEVAL 10 (BASKET) ×1
BANDAGE ELASTIC 2 LF NS (GAUZE/BANDAGES/DRESSINGS) ×1 IMPLANT
BNDG CMPR MED 5X2 ELC HKLP NS (GAUZE/BANDAGES/DRESSINGS) ×1
CHLORAPREP W/TINT 26ML (MISCELLANEOUS) ×2 IMPLANT
CLOTH BEACON ORANGE TIMEOUT ST (SAFETY) ×2 IMPLANT
COVER LIGHT HANDLE STERIS (MISCELLANEOUS) ×4 IMPLANT
CUTTER FLEX LINEAR 45M (STAPLE) ×2 IMPLANT
DECANTER SPIKE VIAL GLASS SM (MISCELLANEOUS) ×2 IMPLANT
ELECT REM PT RETURN 9FT ADLT (ELECTROSURGICAL) ×2
ELECTRODE REM PT RTRN 9FT ADLT (ELECTROSURGICAL) ×1 IMPLANT
EVACUATOR SMOKE 8.L (FILTER) ×2 IMPLANT
FORMALIN 10 PREFIL 120ML (MISCELLANEOUS) ×2 IMPLANT
GLOVE BIOGEL PI IND STRL 7.0 (GLOVE) ×2 IMPLANT
GLOVE BIOGEL PI IND STRL 7.5 (GLOVE) IMPLANT
GLOVE BIOGEL PI INDICATOR 7.0 (GLOVE) ×3
GLOVE BIOGEL PI INDICATOR 7.5 (GLOVE) ×1
GLOVE SURG SS PI 7.5 STRL IVOR (GLOVE) ×2 IMPLANT
GOWN STRL REUS W/ TWL XL LVL3 (GOWN DISPOSABLE) ×1 IMPLANT
GOWN STRL REUS W/TWL LRG LVL3 (GOWN DISPOSABLE) ×2 IMPLANT
GOWN STRL REUS W/TWL XL LVL3 (GOWN DISPOSABLE) ×2
INST SET LAPROSCOPIC AP (KITS) ×2 IMPLANT
IV NS IRRIG 3000ML ARTHROMATIC (IV SOLUTION) ×1 IMPLANT
KIT ROOM TURNOVER APOR (KITS) ×2 IMPLANT
MANIFOLD NEPTUNE II (INSTRUMENTS) ×2 IMPLANT
NDL INSUFFLATION 14GA 120MM (NEEDLE) ×1 IMPLANT
NEEDLE INSUFFLATION 14GA 120MM (NEEDLE) ×2 IMPLANT
NS IRRIG 1000ML POUR BTL (IV SOLUTION) ×2 IMPLANT
PACK LAP CHOLE LZT030E (CUSTOM PROCEDURE TRAY) ×2 IMPLANT
PAD ARMBOARD 7.5X6 YLW CONV (MISCELLANEOUS) ×2 IMPLANT
PENCIL HANDSWITCHING (ELECTRODE) ×2 IMPLANT
RELOAD 45 VASCULAR/THIN (ENDOMECHANICALS) ×2 IMPLANT
RELOAD STAPLE 45 2.5 WHT GRN (ENDOMECHANICALS) IMPLANT
RELOAD STAPLE 45 3.5 BLU ETS (ENDOMECHANICALS) IMPLANT
RELOAD STAPLE TA45 3.5 REG BLU (ENDOMECHANICALS) IMPLANT
SET BASIN LINEN APH (SET/KITS/TRAYS/PACK) ×2 IMPLANT
SET TUBE IRRIG SUCTION NO TIP (IRRIGATION / IRRIGATOR) ×1 IMPLANT
SHEARS HARMONIC ACE PLUS 36CM (ENDOMECHANICALS) ×2 IMPLANT
SPONGE GAUZE 2X2 8PLY STRL LF (GAUZE/BANDAGES/DRESSINGS) ×6 IMPLANT
STAPLER VISISTAT (STAPLE) ×2 IMPLANT
SUT ETHILON 3 0 FSL (SUTURE) ×1 IMPLANT
SUT VICRYL 0 UR6 27IN ABS (SUTURE) ×2 IMPLANT
SYS BAG RETRIEVAL 10MM (BASKET) ×1
SYSTEM BAG RETRIEVAL 10MM (BASKET) ×1 IMPLANT
TAPE HYPAFIX 4 X30'CHARGABLE (GAUZE/BANDAGES/DRESSINGS) ×1 IMPLANT
TRAY FOLEY CATH SILVER 16FR (SET/KITS/TRAYS/PACK) ×2 IMPLANT
TROCAR ENDO BLADELESS 11MM (ENDOMECHANICALS) ×2 IMPLANT
TROCAR ENDO BLADELESS 12MM (ENDOMECHANICALS) ×2 IMPLANT
TROCAR XCEL NON-BLD 5MMX100MML (ENDOMECHANICALS) ×2 IMPLANT
TUBING INSUFFLATION (TUBING) ×2 IMPLANT
WARMER LAPAROSCOPE (MISCELLANEOUS) ×2 IMPLANT
YANKAUER SUCT 12FT TUBE ARGYLE (SUCTIONS) ×2 IMPLANT

## 2016-08-10 NOTE — Progress Notes (Signed)
Subjective: Patient feels a little better, but still has lower abdominal pain. No diarrhea overnight.  Objective: Vital signs in last 24 hours: Temp:  [97.5 F (36.4 C)-98.4 F (36.9 C)] 97.7 F (36.5 C) (07/02 0442) Pulse Rate:  [79-116] 79 (07/02 0442) Resp:  [16-18] 18 (07/02 0442) BP: (87-132)/(55-73) 87/55 (07/02 0442) SpO2:  [97 %-100 %] 100 % (07/02 0442) Weight:  [168 lb (76.2 kg)-168 lb 6.4 oz (76.4 kg)] 168 lb 6.4 oz (76.4 kg) (07/01 2105) Last BM Date: 08/08/16  Intake/Output from previous day: 07/01 0701 - 07/02 0700 In: 888.3 [P.O.:290; I.V.:548.3; IV Piggyback:50] Out: 900 [Urine:900] Intake/Output this shift: No intake/output data recorded.  General appearance: alert, cooperative and no distress GI: Soft with more tenderness in the right lower quadrant than the left lower quadrant now. No rigidity noted.  Lab Results:   Recent Labs  08/09/16 1546 08/10/16 0554  WBC 21.1* 14.9*  HGB 13.4 10.9*  HCT 39.5 31.6*  PLT 355 290   BMET  Recent Labs  08/09/16 1546 08/10/16 0554  NA 136 134*  K 3.1* 3.6  CL 95* 103  CO2 24 25  GLUCOSE 112* 114*  BUN 42* 27*  CREATININE 1.91* 0.98  CALCIUM 9.3 7.7*   PT/INR No results for input(s): LABPROT, INR in the last 72 hours.  Studies/Results: Ct Abdomen Pelvis W Contrast  Result Date: 08/09/2016 CLINICAL DATA:  Pain with nausea, vomiting, and diarrhea for 5 days. EXAM: CT ABDOMEN AND PELVIS WITH CONTRAST TECHNIQUE: Multidetector CT imaging of the abdomen and pelvis was performed using the standard protocol following bolus administration of intravenous contrast. CONTRAST:  78mL ISOVUE-300 IOPAMIDOL (ISOVUE-300) INJECTION 61% COMPARISON:  None. FINDINGS: Lower chest: No acute abnormality. Hepatobiliary: No focal liver abnormality is seen. No gallstones, gallbladder wall thickening, or biliary dilatation. Pancreas: Unremarkable. No pancreatic ductal dilatation or surrounding inflammatory changes. Spleen: Normal in  size without focal abnormality. Adrenals/Urinary Tract: Adrenal glands are unremarkable. Kidneys are normal, without renal calculi, focal lesion, or hydronephrosis. Bladder is unremarkable. Stomach/Bowel: The stomach is normal in appearance. The small bowel is dilated along much of its length with a loop measuring 4.1 cm in the left upper quadrant image 28 of series 2. Dilated loops extend into the pelvis. The distal most aspect of the ileum is relatively decompressed. No discrete transition point is identified. The colon is relatively decompressed as well. There is an appendicolith at the origin of the appendix. The appendix is dilated and inflamed with adjacent stranding. A small amount of adjacent fluid appears be relatively free rather than loculated. An appendicolith is seen at the distal tip of the appendix is well. There is likely secondary inflammation of the transverse colon as it crosses superior to the inflamed appendix Vascular/Lymphatic: No significant vascular findings are present. No enlarged abdominal or pelvic lymph nodes. Reproductive: An IUD is seen in the uterus. There is probably a dominant follicle in the right ovary. The left ovary is normal. Other: There is fluid in the pelvis. A pocket of fluid to the left on series 2, image 79 measures 10 x 2.9 cm. A smaller pocket posteriorly on the right measures 4.5 by 2.5 cm. Musculoskeletal: No acute or significant osseous findings. IMPRESSION: 1. Appendicitis. No adjacent free air to suggest perforation. A small amount of adjacent fluid appears relatively non loculated with no enhancing wall. Fluid in the pelvis with the largest collection measuring 10 x 2.9 cm could be reactive sterile fluid. However, developing abscesses are not excluded. 2. Small  bowel dilatation extending into the pelvis. While a small bowel obstruction is not excluded, the dilatation could represent a small bowel ileus secondary to the appendicitis. Recommend clinical  correlation. Findings called to Dr. Roderic Palau. Electronically Signed   By: Dorise Bullion III M.D   On: 08/09/2016 18:12    Anti-infectives: Anti-infectives    Start     Dose/Rate Route Frequency Ordered Stop   08/09/16 2200  piperacillin-tazobactam (ZOSYN) IVPB 3.375 g     3.375 g 12.5 mL/hr over 240 Minutes Intravenous Every 8 hours 08/09/16 2111        Assessment/Plan: Impression: Abdominal pain, now localizing to right lower quadrant. Leukocytosis improved. Plan: Will proceed with laparoscopic appendectomy and drainage of pelvic fluid. The risks and benefits of the procedure including bleeding, infection, and the possibility of an open procedure were fully explained to the patient, who gave informed consent.  LOS: 0 days    Aviva Signs 08/10/2016

## 2016-08-10 NOTE — Plan of Care (Signed)
Problem: Pain Managment: Goal: General experience of comfort will improve Outcome: Progressing Pt c/o abdominal pain. Dilaudid given x1 so far this shift with resolving pain. Increased pain when pt up to BR. Pt educated on pain mgmt and pain medications. Will continue to monitor pt.

## 2016-08-10 NOTE — Anesthesia Preprocedure Evaluation (Signed)
Anesthesia Evaluation  Patient identified by MRN, date of birth, ID band Patient awake    Reviewed: Allergy & Precautions, NPO status , Patient's Chart, lab work & pertinent test results  Airway Mallampati: I  TM Distance: >3 FB     Dental  (+) Teeth Intact   Pulmonary neg pulmonary ROS,    breath sounds clear to auscultation       Cardiovascular negative cardio ROS   Rhythm:Regular Rate:Normal     Neuro/Psych negative neurological ROS  negative psych ROS   GI/Hepatic Neg liver ROS, Acute appendicitis    Endo/Other  negative endocrine ROS  Renal/GU negative Renal ROS     Musculoskeletal   Abdominal   Peds  Hematology negative hematology ROS (+)   Anesthesia Other Findings   Reproductive/Obstetrics                             Anesthesia Physical Anesthesia Plan  ASA: I  Anesthesia Plan: General   Post-op Pain Management:    Induction: Intravenous, Rapid sequence and Cricoid pressure planned  PONV Risk Score and Plan:   Airway Management Planned:   Additional Equipment:   Intra-op Plan:   Post-operative Plan: Extubation in OR  Informed Consent: I have reviewed the patients History and Physical, chart, labs and discussed the procedure including the risks, benefits and alternatives for the proposed anesthesia with the patient or authorized representative who has indicated his/her understanding and acceptance.     Plan Discussed with:   Anesthesia Plan Comments:         Anesthesia Quick Evaluation

## 2016-08-10 NOTE — Progress Notes (Addendum)
Pts BP 88/55. Order to page physician is SBP<90. Dr. Arnoldo Morale (on-call)  Paged and made aware as well that Oxycodone was given at 0230.  Dr. Arnoldo Morale also made aware of pt hallucinating after receiving Dilaudid. Order changed to Morphine IV.

## 2016-08-10 NOTE — Op Note (Signed)
Patient:  Angela Smith  DOB:  01-26-1976  MRN:  [id number]   Preop Diagnosis:  Acute appendicitis  Postop Diagnosis:  Same, perforated appendicitis  Procedure:  Laparoscopic appendectomy  Surgeon:  Aviva Signs, M.D.  Anes:  Gen. endotracheal  Indications:  Patient is a 41 year old white female who presented [facility] with a five-day history of nonspecific abdominal pain. CT scan of the abdomen revealed probable acute appendicitis with appendicoliths, but multiple pelvic fluid collections. The patient now comes to the operating room for a laparoscopic appendectomy. The risks and benefits of the procedure including bleeding, infection, and the possibility of an open procedure were fully explained to the patient, who gave informed consent.  Procedure note:  The patient was placed the supine position. After induction of general endotracheal anesthesia, the abdomen was prepped and draped using the usual sterile technique with DuraPrep. Surgical site confirmation was performed.  An infraumbilical incision was made down to the fascia. A Veress needle was introduced into the abdominal cavity and confirmation of placement was done using the saline drop test. The abdomen was then insufflated to 16 mmHg pressure. An 11 mm trocar was introduced into the abdominal cavity under direct visualization without difficulty. The patient was placed in deeper Trendelenburg position and an additional 12 mm trocar was placed in the suprapubic region and a 5 mm trocar was placed left lower quadrant region. The omentum was noted to be encasing a perforated near gangrenous appendix that was traversing the lower half of the abdomen. In the pelvis, the fluid collections that were seen on CT scan was purulent in nature. These abscess cavities were broken up and aspirated. The mesoappendix was divided using the Harmonic scalpel. A vascular Endo GIA was placed across the base the appendix and fired. The appendix was  then removed using an Endo Catch bag from the operative field. It was sent to pathology for examination. The staple line was inspected and noted to be within normal limits. The abdominal cavity was then copiously irrigated with normal saline until the affluent was clear. A #10 flat Jackson by drain was placed into the pelvis and brought through a 5 mm trocar site. It was secured at the skin level using a 3-0 nylon interrupted suture. All fluid and air were then evacuated from the abdominal cavity prior to the removal of the trochars.  All wounds were irrigated with normal saline. All wounds were injected with 0.5% Sensorcaine. The infraumbilical fascia as well as suprapubic fascia were reapproximated using 0 Vicryl interrupted sutures. All skin incisions were closed using staples. Betadine ointment and dry sterile dressings were applied.  All tape and needle counts were correct at the end of the procedure. The patient was extubated in the operating room and transferred to PACU in stable condition.  Complications:  None  EBL:  Minimal  Specimen:  Appendix  Drains: Jackson-Pratt drain to pelvis

## 2016-08-10 NOTE — Anesthesia Postprocedure Evaluation (Signed)
Anesthesia Post Note  Patient: Angela Smith  Procedure(s) Performed: Procedure(s) (LRB): APPENDECTOMY LAPAROSCOPIC (N/A)  Patient location during evaluation: PACU Anesthesia Type: General Level of consciousness: awake and alert, oriented and patient cooperative Pain management: pain level controlled Vital Signs Assessment: post-procedure vital signs reviewed and stable Respiratory status: spontaneous breathing, respiratory function stable and patient connected to nasal cannula oxygen Cardiovascular status: blood pressure returned to baseline and stable Postop Assessment: no headache and adequate PO intake Anesthetic complications: no     Last Vitals:  Vitals:   08/10/16 1335 08/10/16 1340  BP: 104/64 99/64  Pulse:    Resp: 20 (!) 23  Temp:      Last Pain:  Vitals:   08/10/16 1122  TempSrc: Oral  PainSc:                  Brynnleigh Mcelwee

## 2016-08-10 NOTE — Progress Notes (Signed)
Late entry from 2315: Pt resting quietly in room when nurse entered. Husband stated that pt was hallucinating. Husband stated pt kept stating her kids were there and trying to hit her with balloons. RN adv pt that Dilaudid was very strong and could cause her to hallucinate. RN stated Dilaudid will be held and pt can receive po pain medication if needed. Pt verbalized understanding. No more c/o hallucinations afterward.

## 2016-08-10 NOTE — Transfer of Care (Signed)
Immediate Anesthesia Transfer of Care Note  Patient: Angela Smith  Procedure(s) Performed: Procedure(s): APPENDECTOMY LAPAROSCOPIC (N/A)  Patient Location: PACU  Anesthesia Type:General  Level of Consciousness: awake, alert , oriented and patient cooperative  Airway & Oxygen Therapy: Patient Spontanous Breathing and Patient connected to nasal cannula oxygen  Post-op Assessment: Report given to RN and Post -op Vital signs reviewed and stable  Post vital signs: Reviewed and stable  Last Vitals:  Vitals:   08/10/16 1335 08/10/16 1340  BP: 104/64 99/64  Pulse:    Resp: 20 (!) 23  Temp:      Last Pain:  Vitals:   08/10/16 1122  TempSrc: Oral  PainSc:          Complications: No apparent anesthesia complications

## 2016-08-11 ENCOUNTER — Encounter (HOSPITAL_COMMUNITY): Payer: Self-pay | Admitting: General Surgery

## 2016-08-11 LAB — BASIC METABOLIC PANEL
ANION GAP: 11 (ref 5–15)
BUN: 11 mg/dL (ref 6–20)
CO2: 22 mmol/L (ref 22–32)
Calcium: 7.9 mg/dL — ABNORMAL LOW (ref 8.9–10.3)
Chloride: 101 mmol/L (ref 101–111)
Creatinine, Ser: 0.87 mg/dL (ref 0.44–1.00)
GFR calc Af Amer: >60 mL/min (ref >60)
GLUCOSE: 91 mg/dL (ref 65–99)
POTASSIUM: 3.6 mmol/L (ref 3.5–5.1)
Sodium: 134 mmol/L — ABNORMAL LOW (ref 135–145)

## 2016-08-11 LAB — CBC
HEMATOCRIT: 33.9 % — AB (ref 36.0–46.0)
HEMOGLOBIN: 11.4 g/dL — AB (ref 12.0–15.0)
MCH: 30.2 pg (ref 26.0–34.0)
MCHC: 33.6 g/dL (ref 30.0–36.0)
MCV: 89.9 fL (ref 78.0–100.0)
Platelets: 337 10*3/uL (ref 150–400)
RBC: 3.77 MIL/uL — ABNORMAL LOW (ref 3.87–5.11)
RDW: 15.5 % (ref 11.5–15.5)
WBC: 14.3 10*3/uL — ABNORMAL HIGH (ref 4.0–10.5)

## 2016-08-11 MED ORDER — AMOXICILLIN-POT CLAVULANATE 875-125 MG PO TABS: 1.0000 | ORAL_TABLET | Freq: Two times a day (BID) | ORAL | 0 refills | Status: DC

## 2016-08-11 MED ORDER — OXYCODONE-ACETAMINOPHEN 7.5-325 MG PO TABS: 1.0000 | ORAL_TABLET | ORAL | 0 refills | Status: DC | PRN

## 2016-08-11 NOTE — Care Management Note (Signed)
Case Management Note  Patient Details  Name: Angela Smith MRN: [id number] Date of Birth: November 28, 1975  Subjective/Objective:                  S/p lap appendectomy. Chart reviewed for CM needs. Pt from home, has support system, has pcp, transportation and insurance with drug coverage.   Action/Plan: DC home today with self care.   Expected Discharge Date:  08/11/16               Expected Discharge Plan:  Home/Self Care  In-House Referral:  NA  Discharge planning Services  CM Consult  Post Acute Care Choice:  NA Choice offered to:  NA  Status of Service:  Completed, signed off  Sherald Barge, RN 08/11/2016, 9:29 AM

## 2016-08-11 NOTE — Addendum Note (Signed)
Addendum  created 08/11/16 0835 by Charmaine Downs, CRNA   Sign clinical note

## 2016-08-11 NOTE — Progress Notes (Signed)
Discharge instructions read to patient and family .  All verbalized understanding of instructions.  Discharged to home with family. 

## 2016-08-11 NOTE — Anesthesia Postprocedure Evaluation (Signed)
Anesthesia Post Note  Patient: Angela Smith  Procedure(s) Performed: Procedure(s) (LRB): APPENDECTOMY LAPAROSCOPIC (N/A)  Patient location during evaluation: Nursing Unit Anesthesia Type: General Level of consciousness: awake and alert and patient cooperative Pain management: pain level controlled Vital Signs Assessment: post-procedure vital signs reviewed and stable Respiratory status: spontaneous breathing, nonlabored ventilation and respiratory function stable Cardiovascular status: blood pressure returned to baseline Postop Assessment: adequate PO intake and no signs of nausea or vomiting Anesthetic complications: no     Last Vitals:  Vitals:   08/11/16 0015 08/11/16 0352  BP:  (!) 89/65  Pulse: 97 91  Resp:  16  Temp: 37.3 C 37 C    Last Pain:  Vitals:   08/11/16 0805  TempSrc:   PainSc: 1                  Shaunita Seney J

## 2016-08-11 NOTE — Discharge Instructions (Signed)
Laparoscopic Appendectomy, Adult, Care After °Refer to this sheet in the next few weeks. These instructions provide you with information about caring for yourself after your procedure. Your health care provider may also give you more specific instructions. Your treatment has been planned according to current medical practices, but problems sometimes occur. Call your health care provider if you have any problems or questions after your procedure. °What can I expect after the procedure? °After the procedure, it is common to have: °· A decrease in your energy level. °· Mild pain in the area where the surgical cuts (incisions) were made. °· Constipation. This can be caused by pain medicine and a decrease in your activity. ° °Follow these instructions at home: °Medicines °· Take over-the-counter and prescription medicines only as told by your health care provider. °· Do not drive for 24 hours if you received a sedative. °· Do not drive or operate heavy machinery while taking prescription pain medicine. °· If you were prescribed an antibiotic medicine, take it as told by your health care provider. Do not stop taking the antibiotic even if you start to feel better. °Activity °· For 3 weeks or as long as told by your health care provider: °? Do not lift anything that is heavier than 10 pounds (4.5 kg). °? Do not play contact sports. °· Gradually return to your normal activities. Ask your health care provider what activities are safe for you. °Bathing °· Keep your incisions clean and dry. Clean them as often as told by your health care provider: °? Gently wash the incisions with soap and water. °? Rinse the incisions with water to remove all soap. °? Pat the incisions dry with a clean towel. Do not rub the incisions. °· You may take showers after 48 hours. °· Do not take baths, swim, or use hot tubs for 2 weeks or as told by your health care provider. °Incision care °· Follow instructions from your healthcare provider about  how to take care of your incisions. Make sure you: °? Wash your hands with soap and water before you change your bandage (dressing). If soap and water are not available, use hand sanitizer. °? Change your dressing as told by your health care provider. °? Leave stitches (sutures), skin glue, or adhesive strips in place. These skin closures may need to stay in place for 2 weeks or longer. If adhesive strip edges start to loosen and curl up, you may trim the loose edges. Do not remove adhesive strips completely unless your health care provider tells you to do that. °· Check your incision areas every day for signs of infection. Check for: °? More redness, swelling, or pain. °? More fluid or blood. °? Warmth. °? Pus or a bad smell. °Other Instructions °· If you were sent home with a drain, follow instructions from your health care provider about how to care for the drain and how to empty it. °· Take deep breaths. This helps to prevent your lungs from becoming inflamed. °· To relieve and prevent constipation: °? Drink plenty of fluids. °? Eat plenty of fruits and vegetables. °· Keep all follow-up visits as told by your health care provider. This is important. °Contact a health care provider if: °· You have more redness, swelling, or pain around an incision. °· You have more fluid or blood coming from an incision. °· Your incision feels warm to the touch. °· You have pus or a bad smell coming from an incision or dressing. °· Your incision   edges break open after your sutures have been removed.  You have increasing pain in your shoulders.  You feel dizzy or you faint.  You develop shortness of breath.  You keep feeling nauseous or vomiting.  You have diarrhea or you cannot control your bowel functions.  You lose your appetite.  You develop swelling or pain in your legs. Get help right away if:  You have a fever.  You develop a rash.  You have difficulty breathing.  You have sharp pains in your  chest. This information is not intended to replace advice given to you by your health care provider. Make sure you discuss any questions you have with your health care provider. Document Released: 01/26/2005 Document Revised: 06/28/2015 Document Reviewed: 07/16/2014 Elsevier Interactive Patient Education  2017 [place] Surgical drains are used to remove extra fluid that normally builds up in a surgical wound after surgery. A surgical drain helps to heal a surgical wound. Different kinds of surgical drains include:  Active drains. These drains use suction to pull drainage away from the surgical wound. Drainage flows through a tube to a container outside of the body. It is important to keep the bulb or the drainage container flat (compressed) at all times, except while you empty it. Flattening the bulb or container creates suction. The two most common types of active drains are bulb drains and Hemovac drains.  Passive drains. These drains allow fluid to drain naturally, by gravity. Drainage flows through a tube to a bandage (dressing) or a container outside of the body. Passive drains do not need to be emptied. The most common type of passive drain is the Penrose drain.  A drain is placed during surgery. Immediately after surgery, drainage is usually bright red and a little thicker than water. The drainage may gradually turn yellow or pink and become thinner. It is likely that your health care provider will remove the drain when the drainage stops or when the amount decreases to 1-2 Tbsp (15-30 mL) during a 24-hour period. How to care for your surgical drain  Keep the skin around the drain dry and covered with a dressing at all times.  Check your drain area every day for signs of infection. Check for: ? More redness, swelling, or pain. ? Pus or a bad smell. ? Cloudy drainage. Follow instructions from your health care provider about how to take care of your drain and  how to change your dressing. Change your dressing at least one time every day. Change it more often if needed to keep the dressing dry. Make sure you: 1. Gather your supplies, including: ? Tape. ? Germ-free cleaning solution (sterile saline). ? Split gauze drain sponge: 4 x 4 inches (10 x 10 cm). ? Gauze square: 4 x 4 inches (10 x 10 cm). 2. Wash your hands with soap and water before you change your dressing. If soap and water are not available, use hand sanitizer. 3. Remove the old dressing. Avoid using scissors to do that. 4. Use sterile saline to clean your skin around the drain. 5. Place the tube through the slit in a drain sponge. Place the drain sponge so that it covers your wound. 6. Place the gauze square or another drain sponge on top of the drain sponge that is on the wound. Make sure the tube is between those layers. 7. Tape the dressing to your skin. 8. If you have an active bulb or Hemovac drain, tape the drainage tube  to your skin 1-2 inches (2.5-5 cm) below the place where the tube enters your body. Taping keeps the tube from pulling on any stitches (sutures) that you have. 9. Wash your hands with soap and water. 10. Write down the color of your drainage and how often you change your dressing.  How to empty your active bulb or Hemovac drain 1. Make sure that you have a measuring cup that you can empty your drainage into. 2. Wash your hands with soap and water. If soap and water are not available, use hand sanitizer. 3. Gently move your fingers down the tube while squeezing very lightly. This is called stripping the tube. This clears any drainage, clots, or tissue from the tube. ? Do not pull on the tube. ? You may need to strip the tube several times every day to keep the tube clear. 4. Open the bulb cap or the drain plug. Do not touch the inside of the cap or the bottom of the plug. 5. Empty all of the drainage into the measuring cup. 6. Compress the bulb or the container and  replace the cap or the plug. To compress the bulb or the container, squeeze it firmly in the middle while you close the cap or plug the container. 7. Write down the amount of drainage that you have in each 24-hour period. If you have less than 2 Tbsp (30 mL) of drainage during 24 hours, contact your health care provider. 8. Flush the drainage down the toilet. 9. Wash your hands with soap and water. Contact a health care provider if:  You have more redness, swelling, or pain around your drain area.  The amount of drainage that you have is increasing instead of decreasing.  You have pus or a bad smell coming from your drain area.  You have a fever.  You have drainage that is cloudy.  There is a sudden stop or a sudden decrease in the amount of drainage that you have.  Your tube falls out.  Your active draindoes not stay compressedafter you empty it. This information is not intended to replace advice given to you by your health care provider. Make sure you discuss any questions you have with your health care provider. Document Released: 01/24/2000 Document Revised: 07/04/2015 Document Reviewed: 08/15/2014 Elsevier Interactive Patient Education  2018 [employer].

## 2016-08-11 NOTE — Discharge Summary (Signed)
Physician Discharge Summary  Patient ID: Angela Smith MRN: [id number] DOB/AGE: February 22, 1975 41 y.o.  Admit date: 08/09/2016 Discharge date: 08/11/2016  Admission Diagnoses:Acute appendicitis  Discharge Diagnoses: Same, perforated with intra-abdominal abscess Active Problems:   Abdominal pain   Perforated appendicitis   Discharged Condition: good  Hospital Course: Patient is a 41 year old white female who presented to the emergency room on 08/09/2016 with a five-day history of worsening abdominal pain. [place] and it just returned on that day. CT scan of the abdomen revealed acute appendicitis with pelvic fluid collections. She was admitted on hospital for IV antibiotics. The following day, she underwent a laparoscopic appendectomy. She did have a perforated appendicitis. The abdomen was cleaned out and a drain was left. She tolerated the procedure well. Her postoperative course was remarkable for 1 fever to 103. Her JP drainage is serosanguineous in nature. Her white blood cell count is elevated but is normalizing. She is being discharged home on 08/11/2016 in good and improving condition.  Treatments: surgery: Laparoscopic appendectomy on 08/10/2016.  Discharge Exam: Blood pressure (!) 89/65, pulse 91, temperature 98.6 F (37 C), temperature source Oral, resp. rate 16, height 5\' 9"  (1.753 m), weight 168 lb 6.4 oz (76.4 kg), SpO2 100 %. General appearance: alert, cooperative and no distress Resp: clear to auscultation bilaterally Cardio: regular rate and rhythm, S1, S2 normal, no murmur, click, rub or gallop GI: Soft, dressings dry and intact. JP drain in place.  Disposition: 01-Home or Self Care  Discharge Instructions    Diet general    Complete by:  As directed    Increase activity slowly    Complete by:  As directed      Allergies as of 08/11/2016      Reactions   Dilaudid [hydromorphone] Other (See Comments)   Hallucinations   Rhinocort [budesonide]  Palpitations   Heart palpitations      Medication List    TAKE these medications   amoxicillin-clavulanate 875-125 MG tablet Commonly known as:  AUGMENTIN Take 1 tablet by mouth 2 (two) times daily.   ibuprofen 200 MG tablet Commonly known as:  ADVIL,MOTRIN Take 400 mg by mouth every 6 (six) hours as needed for headache, moderate pain or cramping.   MIRENA IU by Intrauterine route. PLACED 2011   oxyCODONE-acetaminophen 7.5-325 MG tablet Commonly known as:  PERCOCET Take 1-2 tablets by mouth every 4 (four) hours as needed.      Follow-up Information    Aviva Signs, MD. Schedule an appointment as soon as possible for a visit on 08/18/2016.   Specialty:  General Surgery Contact information: [id number] [place] [address] [phone]           Signed: Aviva Signs 08/11/2016, 8:19 AM

## 2016-08-18 ENCOUNTER — Encounter: Payer: Self-pay | Admitting: General Surgery

## 2016-08-18 ENCOUNTER — Ambulatory Visit (INDEPENDENT_AMBULATORY_CARE_PROVIDER_SITE_OTHER): Payer: Self-pay | Admitting: General Surgery

## 2016-08-18 VITALS — BP 125/68 | HR 94 | Temp 98.6°F | Resp 18 | Ht 69.0 in | Wt 163.0 lb

## 2016-08-18 DIAGNOSIS — Z09 Encounter for follow-up examination after completed treatment for conditions other than malignant neoplasm: Secondary | ICD-10-CM

## 2016-08-18 NOTE — Progress Notes (Signed)
Subjective:     Angela Smith  Status post laparoscopic appendectomy for perforated appendicitis. Patient states that her JP drain has not put out much over the past few days. She denies any fever or chills. Objective:    BP 125/68   Pulse 94   Temp 98.6 F (37 C)   Resp 18   Ht 5\' 9"  (1.753 m)   Wt 163 lb (73.9 kg)   BMI 24.07 kg/m   General:  alert, cooperative and no distress  Abdomen soft, incisions healing well. Staples removed, Steri-Strips applied. JP drain removed. Final pathology consistent with diagnosis     Assessment:    Doing well postoperatively.    Plan:   Increase activity as able. Should she develop significant fevers, she was instructed to call my office. Follow-up as needed.

## 2016-11-17 ENCOUNTER — Encounter: Payer: Self-pay | Admitting: General Surgery

## 2016-11-17 ENCOUNTER — Ambulatory Visit: Payer: Self-pay | Admitting: Adult Health

## 2016-11-17 ENCOUNTER — Ambulatory Visit (INDEPENDENT_AMBULATORY_CARE_PROVIDER_SITE_OTHER): Payer: BLUE CROSS/BLUE SHIELD | Admitting: General Surgery

## 2016-11-17 ENCOUNTER — Other Ambulatory Visit (HOSPITAL_COMMUNITY): Payer: Self-pay | Admitting: Neurosurgery

## 2016-11-17 VITALS — BP 141/79 | HR 83 | Temp 98.7°F | Resp 18 | Ht 69.0 in | Wt 166.0 lb

## 2016-11-17 DIAGNOSIS — R1084 Generalized abdominal pain: Secondary | ICD-10-CM | POA: Diagnosis not present

## 2016-11-17 DIAGNOSIS — Z9049 Acquired absence of other specified parts of digestive tract: Secondary | ICD-10-CM

## 2016-11-17 DIAGNOSIS — R109 Unspecified abdominal pain: Secondary | ICD-10-CM

## 2016-11-17 DIAGNOSIS — K3532 Acute appendicitis with perforation and localized peritonitis, without abscess: Secondary | ICD-10-CM

## 2016-11-17 NOTE — Progress Notes (Signed)
Subjective:     Angela Smith  Status post laparoscopic appendectomy for perforated appendicitis in July of this year. Patient comes now with a several week history of worsening abdominal pain. It seems to occur after eating but also wakes her up spontaneously in the morning. It starts around her umbilicus and travels down to the suprapubic region. Her whole abdomen seems to hurt. It resolves spontaneously after a few hours. It is not associated with bowel movements. No overt fevers. She currently has a pain level is 2 out of 10. Objective:    BP (!) 141/79   Pulse 83   Temp 98.7 F (37.1 C)   Resp 18   Ht 5\' 9"  (1.753 m)   Wt 166 lb (75.3 kg)   BMI 24.51 kg/m   General:  alert, cooperative and no distress  Head is normocephalic, atraumatic. Lungs clear auscultation with breath sounds bilaterally. Heart examination reveals a regular rate and rhythm without S3, S4, murmurs. Abdomen is soft, flat. No specific point tenderness noted. All incisions are healed well. No overt hernias present.     Assessment:    Abdominal pain, status post laparoscopic appendectomy for perforated appendicitis    Plan:   We'll get CT scan of abdomen and pelvis with contrast to rule out abscess. Would also like to assess for any occult hernias. We'll see patient in follow-up after the CT scan is performed.

## 2016-11-18 ENCOUNTER — Other Ambulatory Visit: Payer: Self-pay | Admitting: General Surgery

## 2016-11-18 DIAGNOSIS — R109 Unspecified abdominal pain: Secondary | ICD-10-CM

## 2016-11-18 DIAGNOSIS — K3532 Acute appendicitis with perforation and localized peritonitis, without abscess: Secondary | ICD-10-CM

## 2016-11-19 ENCOUNTER — Ambulatory Visit (HOSPITAL_COMMUNITY)
Admission: RE | Admit: 2016-11-19 | Discharge: 2016-11-19 | Disposition: A | Discharge status: Home / Self Care | Payer: BLUE CROSS/BLUE SHIELD | Source: Ambulatory Visit | Attending: Neurosurgery | Admitting: Neurosurgery

## 2016-11-19 DIAGNOSIS — K3532 Acute appendicitis with perforation and localized peritonitis, without abscess: Secondary | ICD-10-CM | POA: Diagnosis present

## 2016-11-19 DIAGNOSIS — R109 Unspecified abdominal pain: Secondary | ICD-10-CM

## 2016-11-19 DIAGNOSIS — R935 Abnormal findings on diagnostic imaging of other abdominal regions, including retroperitoneum: Secondary | ICD-10-CM | POA: Insufficient documentation

## 2016-11-19 MED ORDER — IOPAMIDOL (ISOVUE-300) INJECTION 61%
100.0000 mL | Freq: Once | INTRAVENOUS | Status: AC | PRN
  Administered 2016-11-19: 100 mL via INTRAVENOUS

## 2016-12-08 ENCOUNTER — Ambulatory Visit (INDEPENDENT_AMBULATORY_CARE_PROVIDER_SITE_OTHER): Payer: BLUE CROSS/BLUE SHIELD | Admitting: General Surgery

## 2016-12-08 ENCOUNTER — Encounter: Payer: Self-pay | Admitting: General Surgery

## 2016-12-08 ENCOUNTER — Telehealth: Payer: Self-pay | Admitting: Gastroenterology

## 2016-12-08 VITALS — BP 115/73 | HR 83 | Temp 97.8°F | Resp 18 | Ht 69.0 in | Wt 165.0 lb

## 2016-12-08 DIAGNOSIS — R1084 Generalized abdominal pain: Secondary | ICD-10-CM

## 2016-12-08 NOTE — Telephone Encounter (Signed)
Pt was scheduled to come in tomorrow and she will be a new patient. MS didn't know that we needed for referral to be approved first and Dr Arnoldo Morale wanted patient seen ASAP. I called Dr Arnoldo Morale office and Jackelyn Poling will be sending referral and demographics.

## 2016-12-08 NOTE — Progress Notes (Signed)
Subjective:     Angela Smith  Patient is here for follow-up.  She states her abdominal pain has returned, but is more in the epigastric region.  She states she sometimes has a burning sensation 1 hour after eating.  She denies any significant right upper quadrant abdominal pain, fatty food intolerance.  She denies any significant bloating.  This pain is just as intense as her previous lower pelvic pain.  She denies any fever or chills.  She denies any jaundice.  She does not give a history of diarrhea or constipation.  The pain sometimes radiates to the left upper quadrant.  She currently has a pain of 4 out of 10.  As an aside, she also states that she is losing her hair more than usual. Objective:    BP 115/73   Pulse 83   Temp 97.8 F (36.6 C)   Resp 18   Ht 5\' 9"  (1.753 m)   Wt 165 lb (74.8 kg)   BMI 24.37 kg/m   General:  alert, cooperative and no distress  Abdomen is soft and flat.  She does have mild discomfort in the epigastric region but seems to extend down the left side of her abdomen.  No masses or rigidity are noted.  Incisions are all well-healed. CT scan images done previously was personally reviewed.     Assessment:   Abdominal pain of unknown etiology.  I cannot explain her intermittent episodes of abdominal pain as a result of her appendectomy done 3-1/2 months ago.  CT scan done 3 weeks ago was for the most part unremarkable.  The small fluid collection seen is down in the pelvis and did not enhance to suggest an abscess.  Patient denies any fever or chills. Plan:   Will refer patient to GI to see if they can help figure out what the source of her pain is.  Patient and husband understand and agree.  Follow-up here as needed.

## 2016-12-09 ENCOUNTER — Other Ambulatory Visit: Payer: Self-pay

## 2016-12-09 ENCOUNTER — Ambulatory Visit (INDEPENDENT_AMBULATORY_CARE_PROVIDER_SITE_OTHER): Payer: BLUE CROSS/BLUE SHIELD | Admitting: Gastroenterology

## 2016-12-09 ENCOUNTER — Encounter: Payer: Self-pay | Admitting: Gastroenterology

## 2016-12-09 VITALS — BP 117/77 | HR 71 | Temp 98.5°F | Ht 69.0 in | Wt 164.6 lb

## 2016-12-09 DIAGNOSIS — R109 Unspecified abdominal pain: Secondary | ICD-10-CM

## 2016-12-09 DIAGNOSIS — R1013 Epigastric pain: Secondary | ICD-10-CM

## 2016-12-09 MED ORDER — PANTOPRAZOLE SODIUM 40 MG PO TBEC: 40.0000 mg | DELAYED_RELEASE_TABLET | Freq: Every day | ORAL | 1 refills | Status: DC

## 2016-12-09 NOTE — Assessment & Plan Note (Signed)
41 year old very pleasant female with several month history of vague abdominal pain that is located in RUQ/LUQ/epigastric, and some intermittent lower abdominal discomfort. Postprandial component and associated nausea. Denies routine use of NSAIDs but does take Motrin only as needed. No typical reflux symptoms. No lower GI symptoms to suggest etiology such as constipation or IBS. Recent CT on file with expected post-operative changes after laparoscopic appendectomy for perforated appendicitis in July 2018. Seen recently by Dr. Arnoldo Morale, who has referred her to [place] for further GI evaluation. Differentials including gastritis, PUD, less likely biliary. CT on file without evidence for pancreatitis or obvious hepatobiliary issues. Will update labs to include CBC, CMP now. Incidentally notes hair thinning, so I have ordered a TSH. Will pursue EGD due to dyspepsia in near future, while completing a RUQ ultrasound in interim as CT would not be as sensitive for gallstones. Start PPI empirically as well.   Proceed with upper endoscopy in the near future with Dr. Oneida Alar. The risks, benefits, and alternatives have been discussed in detail with patient. They have stated understanding and desire to proceed.  RUQ [place] Protonix once daily sent to pharmacy Labs ordered including pregnancy screen to be thorough Further recommendations to follow

## 2016-12-09 NOTE — Patient Instructions (Addendum)
Please have blood work completed today.   I have sent in Protonix to take once each morning, 30 minutes before breakfast.  We are arranging an upper endoscopy with Dr. Oneida Alar in the near future.  In the meantime, I have ordered an ultrasound of your gallbladder.   We will see what things show as the tests are completed!  It was a pleasure to see you today. I strive to create trusting relationships with patients to provide genuine, compassionate, and quality care. I value your feedback. If you receive a survey regarding your visit,  I greatly appreciate you the taking time to fill this out.   Annitta Needs, PhD, ANP-BC [facility]

## 2016-12-09 NOTE — Progress Notes (Addendum)
REVIEWED-NO ADDITIONAL RECOMMENDATIONS.  Referring Provider: Dr. Arnoldo Morale  Primary Care Physician:  Unk Pinto, MD Primary GI: Dr. Oneida Alar    Chief Complaint  Patient presents with  . Abdominal Pain    x 3 months post appendectomy    HPI:   Angela Smith is a 41 y.o. female presenting today at the request of Dr. Arnoldo Morale secondary to abdominal pain. She underwent laparoscopic appendectomy for perforated appendicitis in July 2018. CT most recently mid October with fluid collection noted but not felt to represent an abscess. Referred to GI for further evaluation.   2 weeks post-appendectomy started eating solid foods. Would have pain in right side at ribcage, lasting 30 min to an hour. 3 weeks ago worse and constant for 5 days. Went for CT scan. Pain at ribcage at right side, epigastric. Gassy and nauseated this morning. Last night pain was in bilateral sides. Now has postprandial abdominal pain. Woke up with pain then better for a few hours. Ate something and hurt for an hour but now no pain. If in dire straights will take Motrin. No aspirin powders. No typical reflux symptoms. No dysphagia. Pain will wrap around to back.   BMs have been normal. No constipation. [place] stool scale # 4, 6, 7 at times. In the fall, hair will shed more than normal. Hair continues to shed and worsens. Pulling out clumps of hair with washing hair.   On a nutritional program that helps with knee discomfort. Knees giving out on her from knee pain. Worsened over past 3 weeks.   Past Medical History:  Diagnosis Date  . Allergy    ALLERGIC RHINITS  . Fibrocystic breast disease   . Hyperlipidemia    DIET/ HERBAL CONTROLED  . Miscarriage 2006   TWO SEPERATE 4 MONTHS APART  . Seizures ([place])    INFANTILE    Past Surgical History:  Procedure Laterality Date  . DILATION AND CURETTAGE OF UTERUS  2006   MISCARRIAGE  . KNEE ARTHROSCOPY Left    X 2  . LAPAROSCOPIC APPENDECTOMY N/A 08/10/2016   Procedure:  APPENDECTOMY LAPAROSCOPIC;  Surgeon: Aviva Signs, MD;  Location: [facility];  Service: General;  Laterality: N/A;    Current Outpatient Prescriptions  Medication Sig Dispense Refill  . ibuprofen (ADVIL,MOTRIN) 200 MG tablet Take 400 mg by mouth every 6 (six) hours as needed for headache, moderate pain or cramping.    . Levonorgestrel (MIRENA IU) by Intrauterine route. PLACED 2011    . pantoprazole (PROTONIX) 40 MG tablet Take 1 tablet (40 mg total) by mouth daily. Take 30 minutes before breakfast daily. 30 tablet 1   No current facility-administered medications for this visit.     Allergies as of 12/09/2016 - Review Complete 12/09/2016  Allergen Reaction Noted  . Dilaudid [hydromorphone] Other (See Comments) 08/10/2016  . Rhinocort [budesonide] Palpitations 11/29/2012    Family History  Problem Relation Age of Onset  . Depression Mother   . Hypertension Father   . Diabetes Maternal Grandmother   . Osteoporosis Maternal Grandmother   . Heart disease Maternal Grandfather   . Hyperlipidemia Maternal Grandfather   . Cancer Maternal Grandfather        PANCREATIC/ BONE  . Cancer Paternal Grandfather        THROAT  . Colon cancer Neg Hx   . Colon polyps Neg Hx     Social History   Social History  . Marital status: Married    Spouse name: N/A  . Number of children:  N/A  . Years of education: N/A   Occupational History  . works from home      [facility] and [place] Topics  . Smoking status: Never Smoker  . Smokeless tobacco: Never Used  . Alcohol use No  . Drug use: No  . Sexual activity: Yes    Birth control/ protection: IUD   Other Topics Concern  . None   Social History Narrative  . None    Review of Systems: Gen: Denies fever, chills, anorexia. Denies fatigue, weakness, weight loss.  CV: Denies chest pain, palpitations, syncope, peripheral edema, and claudication. Resp: Denies dyspnea at rest, cough, wheezing, coughing up blood, and  pleurisy. GI: see HPI  Derm: Denies rash, itching, dry skin Psych: Denies depression, anxiety, memory loss, confusion. No homicidal or suicidal ideation.  Heme: Denies bruising, bleeding, and enlarged lymph nodes.  Physical Exam: BP 117/77   Pulse 71   Temp 98.5 F (36.9 C) (Oral)   Ht 5\' 9"  (1.753 m)   Wt 164 lb 9.6 oz (74.7 kg)   LMP  (Approximate) Comment: LMP 10 years ago  BMI 24.31 kg/m  General:   Alert and oriented. No distress noted. Pleasant and cooperative.  Head:  Normocephalic and atraumatic. Eyes:  Conjuctiva clear without scleral icterus. Mouth:  Oral mucosa pink and moist. Good dentition. No lesions. Lungs: CTA bilaterally  Abdomen:  +BS, soft, mild TTP across lower abdomen, TTP epigastric, LUQ, and RUQ. non-distended. No rebound or guarding. No HSM or masses noted. Msk:  Symmetrical without gross deformities. Normal posture. Extremities:  Without edema. Neurologic:  Alert and  oriented x4 Psych:  Alert and cooperative. Normal mood and affect.

## 2016-12-10 ENCOUNTER — Ambulatory Visit: Payer: BLUE CROSS/BLUE SHIELD | Admitting: General Surgery

## 2016-12-10 LAB — COMPLETE METABOLIC PANEL WITH GFR
AG RATIO: 1.5 (calc) (ref 1.0–2.5)
ALT: 10 U/L (ref 6–29)
AST: 13 U/L (ref 10–30)
Albumin: 4.5 g/dL (ref 3.6–5.1)
Alkaline phosphatase (APISO): 71 U/L (ref 33–115)
BUN: 8 mg/dL (ref 7–25)
CALCIUM: 9.5 mg/dL (ref 8.6–10.2)
CO2: 28 mmol/L (ref 20–32)
CREATININE: 0.8 mg/dL (ref 0.50–1.10)
Chloride: 104 mmol/L (ref 98–110)
GFR, EST AFRICAN AMERICAN: 106 mL/min/{1.73_m2} (ref >=60)
GFR, EST NON AFRICAN AMERICAN: 92 mL/min/{1.73_m2} (ref >=60)
Globulin: 3 g/dL (calc) (ref 1.9–3.7)
Glucose, Bld: 90 mg/dL (ref 65–139)
POTASSIUM: 4.4 mmol/L (ref 3.5–5.3)
Sodium: 139 mmol/L (ref 135–146)
TOTAL PROTEIN: 7.5 g/dL (ref 6.1–8.1)
Total Bilirubin: 0.5 mg/dL (ref 0.2–1.2)

## 2016-12-10 LAB — CBC WITH DIFFERENTIAL/PLATELET
BASOS PCT: 0.4 %
Basophils Absolute: 19 cells/uL (ref 0–200)
EOS ABS: 29 {cells}/uL (ref 15–500)
Eosinophils Relative: 0.6 %
HCT: 38.7 % (ref 35.0–45.0)
HEMOGLOBIN: 13.1 g/dL (ref 11.7–15.5)
LYMPHS ABS: 1781 {cells}/uL (ref 850–3900)
MCH: 30 pg (ref 27.0–33.0)
MCHC: 33.9 g/dL (ref 32.0–36.0)
MCV: 88.8 fL (ref 80.0–100.0)
MPV: 9.5 fL (ref 7.5–12.5)
Monocytes Relative: 6.5 %
NEUTROS ABS: 2659 {cells}/uL (ref 1500–7800)
Neutrophils Relative %: 55.4 %
PLATELETS: 390 10*3/uL (ref 140–400)
RBC: 4.36 10*6/uL (ref 3.80–5.10)
RDW: 12.8 % (ref 11.0–15.0)
TOTAL LYMPHOCYTE: 37.1 %
WBC: 4.8 10*3/uL (ref 3.8–10.8)
WBCMIX: 312 {cells}/uL (ref 200–950)

## 2016-12-10 LAB — VITAMIN D 25 HYDROXY (VIT D DEFICIENCY, FRACTURES): Vit D, 25-Hydroxy: 53 ng/mL (ref 30–100)

## 2016-12-10 LAB — TSH: TSH: 1.81 mIU/L

## 2016-12-10 LAB — PREGNANCY, URINE: Preg Test, Ur: NEGATIVE

## 2016-12-10 NOTE — Progress Notes (Signed)
Left Vm that labs are great, will await the [place] report.  Call if questions.

## 2016-12-10 NOTE — Progress Notes (Signed)
[name]: your blood count, liver numbers, kidney function, Vitamin D, and TSH are all great! Negative pregnancy screen as we expected. We will see what ultrasound shows. (sent in [place])

## 2016-12-10 NOTE — Telephone Encounter (Signed)
OK. Patient was seen by Vicente Males. Looks like referral in the system.

## 2016-12-15 NOTE — Progress Notes (Signed)
CC'D TO PCP °

## 2016-12-16 ENCOUNTER — Ambulatory Visit (HOSPITAL_COMMUNITY)
Admission: RE | Admit: 2016-12-16 | Discharge: 2016-12-16 | Disposition: A | Discharge status: Home / Self Care | Payer: BLUE CROSS/BLUE SHIELD | Source: Ambulatory Visit | Attending: Gastroenterology | Admitting: Gastroenterology

## 2016-12-16 DIAGNOSIS — R1011 Right upper quadrant pain: Secondary | ICD-10-CM | POA: Diagnosis present

## 2016-12-16 DIAGNOSIS — R1013 Epigastric pain: Secondary | ICD-10-CM | POA: Diagnosis present

## 2016-12-16 DIAGNOSIS — R109 Unspecified abdominal pain: Secondary | ICD-10-CM

## 2016-12-22 NOTE — Progress Notes (Signed)
[place] without gallstones. (I sent a note in Mychart) Can we check with radiology to make sure that nothing was left out of the impression? I am able to read the body of the report, but under impression it says "choose 1". I am assuming that it was just cut off during dictating.

## 2016-12-23 NOTE — Progress Notes (Signed)
I called and spoke to Chualar. He checked it and said it is normal. He will have Dr. Golden Circle to do an addendum to the note.

## 2017-01-11 ENCOUNTER — Ambulatory Visit (HOSPITAL_COMMUNITY)
Admission: RE | Admit: 2017-01-11 | Discharge: 2017-01-11 | Disposition: A | Discharge status: Home Health | Payer: BLUE CROSS/BLUE SHIELD | Source: Ambulatory Visit | Attending: Gastroenterology | Admitting: Gastroenterology

## 2017-01-11 ENCOUNTER — Encounter (HOSPITAL_COMMUNITY): Payer: Self-pay | Admitting: *Deleted

## 2017-01-11 ENCOUNTER — Encounter (HOSPITAL_COMMUNITY)
Admission: RE | Disposition: A | Discharge status: Home Health | Payer: Self-pay | Source: Ambulatory Visit | Attending: Gastroenterology

## 2017-01-11 ENCOUNTER — Other Ambulatory Visit: Payer: Self-pay

## 2017-01-11 DIAGNOSIS — K2970 Gastritis, unspecified, without bleeding: Secondary | ICD-10-CM

## 2017-01-11 DIAGNOSIS — R1013 Epigastric pain: Secondary | ICD-10-CM | POA: Diagnosis not present

## 2017-01-11 DIAGNOSIS — K2950 Unspecified chronic gastritis without bleeding: Secondary | ICD-10-CM | POA: Diagnosis not present

## 2017-01-11 HISTORY — PX: ESOPHAGOGASTRODUODENOSCOPY: SHX5428

## 2017-01-11 SURGERY — EGD (ESOPHAGOGASTRODUODENOSCOPY)
Anesthesia: Moderate Sedation

## 2017-01-11 MED ORDER — MIDAZOLAM HCL 5 MG/5ML IJ SOLN
INTRAMUSCULAR | Status: DC | PRN
  Administered 2017-01-11 (×3): 2 mg via INTRAVENOUS

## 2017-01-11 MED ORDER — LIDOCAINE VISCOUS 2 % MT SOLN
OROMUCOSAL | Status: DC | PRN
  Administered 2017-01-11: 4 mL via OROMUCOSAL

## 2017-01-11 MED ORDER — ONDANSETRON HCL 4 MG/2ML IJ SOLN
INTRAMUSCULAR | Status: DC | PRN
  Administered 2017-01-11: 4 mg via INTRAVENOUS

## 2017-01-11 MED ORDER — MEPERIDINE HCL 100 MG/ML IJ SOLN
INTRAMUSCULAR | Status: AC
  Filled 2017-01-11: qty 2

## 2017-01-11 MED ORDER — LIDOCAINE VISCOUS 2 % MT SOLN
OROMUCOSAL | Status: AC
  Filled 2017-01-11: qty 15

## 2017-01-11 MED ORDER — ONDANSETRON HCL 4 MG/2ML IJ SOLN
INTRAMUSCULAR | Status: AC
  Filled 2017-01-11: qty 2

## 2017-01-11 MED ORDER — MEPERIDINE HCL 100 MG/ML IJ SOLN
INTRAMUSCULAR | Status: DC | PRN
  Administered 2017-01-11: 50 mg via INTRAVENOUS
  Administered 2017-01-11 (×2): 25 mg via INTRAVENOUS

## 2017-01-11 MED ORDER — STERILE WATER FOR IRRIGATION IR SOLN
Status: DC | PRN
  Administered 2017-01-11: 100 mL

## 2017-01-11 MED ORDER — SODIUM CHLORIDE 0.9 % IV SOLN
INTRAVENOUS | Status: DC
  Administered 2017-01-11: 09:00:00 via INTRAVENOUS

## 2017-01-11 MED ORDER — MIDAZOLAM HCL 5 MG/5ML IJ SOLN
INTRAMUSCULAR | Status: AC
  Filled 2017-01-11: qty 10

## 2017-01-11 NOTE — Op Note (Signed)
[facility] Patient Name: Angela Smith Procedure Date: 01/11/2017 9:30 AM MRN: [id number] Date of Birth: 1975/02/19 Attending MD: Barney Drain MD, MD CSN: [id number] Age: 41 Admit Type: Outpatient Procedure:                Upper GI endoscopy WITH COLD FORCEPS BIOPSY Indications:              Dyspepsia Providers:                Barney Drain MD, MD, Janeece Riggers, RN, Rosina Lowenstein,                            RN Referring MD:             Unk Pinto Medicines:                Ondansetron 4 mg IV, Meperidine 100 mg IV,                            Midazolam 6 mg IV Complications:            No immediate complications. Estimated Blood Loss:     Estimated blood loss was minimal. Procedure:                Pre-Anesthesia Assessment:                           - Prior to the procedure, a History and Physical                            was performed, and patient medications and                            allergies were reviewed. The patient's tolerance of                            previous anesthesia was also reviewed. The risks                            and benefits of the procedure and the sedation                            options and risks were discussed with the patient.                            All questions were answered, and informed consent                            was obtained. Prior Anticoagulants: The patient has                            taken ibuprofen, last dose was 4 days prior to                            procedure. ASA Grade Assessment: I - A normal,  healthy patient. After reviewing the risks and                            benefits, the patient was deemed in satisfactory                            condition to undergo the procedure. After obtaining                            informed consent, the endoscope was passed under                            direct vision. Throughout the procedure, the                            patient's  blood pressure, pulse, and oxygen                            saturations were monitored continuously. The                            [id number]) was introduced through the mouth,                            and advanced to the second part of duodenum. The                            upper GI endoscopy was accomplished without                            difficulty. The patient tolerated the procedure                            well. Scope In: 9:57:46 AM Scope Out: 10:05:13 AM Total Procedure Duration: 0 hours 7 minutes 27 seconds  Findings:      The examined esophagus was normal.      Patchy mild inflammation characterized by congestion (edema) and       erythema was found in the gastric antrum. Biopsies were taken with a       cold forceps for Helicobacter pylori testing.      The examined duodenum was normal. Biopsies for histology were taken with       a cold forceps for evaluation of celiac disease. Impression:               - MILD Gastritis. Biopsied. Moderate Sedation:      Moderate (conscious) sedation was administered by the endoscopy nurse       and supervised by the endoscopist. The following parameters were       monitored: oxygen saturation, heart rate, blood pressure, and response       to care. Total physician intraservice time was 20 minutes. Recommendation:           - Await pathology results.                           - High fiber diet.                           -  Continue present medications.                           - Return to my office in 4 months.                           - Patient has a contact number available for                            emergencies. The signs and symptoms of potential                            delayed complications were discussed with the                            patient. Return to normal activities tomorrow.                            Written discharge instructions were provided to the                            patient. Procedure  Code(s):        --- Professional ---                           [phone], Esophagogastroduodenoscopy, flexible,                            transoral; with biopsy, single or multiple                           99152, Moderate sedation services provided by the                            same physician or other qualified health care                            professional performing the diagnostic or                            therapeutic service that the sedation supports,                            requiring the presence of an independent trained                            observer to assist in the monitoring of the                            patient's level of consciousness and physiological                            status; initial 15 minutes of intraservice time,  patient age 32 years or older Diagnosis Code(s):        --- Professional ---                           K29.70, Gastritis, unspecified, without bleeding                           R10.13, Epigastric pain CPT copyright 2016 American Medical Association. All rights reserved. The codes documented in this report are preliminary and upon coder review may  be revised to meet current compliance requirements. Barney Drain, MD Barney Drain MD, MD 01/11/2017 10:11:25 AM This report has been signed electronically. Number of Addenda: 0

## 2017-01-11 NOTE — Discharge Instructions (Addendum)
You have mild gastritis. I biopsied your stomach AND SMALL BOWEL.   DRINK WATER TO KEEP YOUR URINE LIGHT YELLOW.  AVOID TRIGGERS FOR GASTRITIS. SEE INFO BELOW.  FOLLOW A LOW FAT DIET.   YOUR BIOPSY RESULTS WILL BE AVAILABLE IN MY CHART AFTER DEC 7 AND MY OFFICE WILL CONTACT YOU IN 10-14 DAYS WITH YOUR RESULTS.   IF THE SOURCE FOR YOUR PAIN IS NOT IDENTIFIED ON BIOPSY WE WILL ORDER A HIDA SCAN next week. YOUR ULTRASOUND (NOV2018) AND CT SCAN(OCT 2018) DID NOT SHOW A PROBLEM WITH YOUR GALLBLADDER OR GALLSTONES.  FOLLOW UP IN 4 MOS.  Wednesday, May 12, 2017 at [facility] the instructions outlined below and refer to this sheet in the next week. These discharge instructions provide you with general information on caring for yourself after you leave the hospital. While your treatment has been planned according to the most current medical practices available, unavoidable complications occasionally occur. If you have any problems or questions after discharge, call DR. FIELDS, [phone].  ACTIVITY  You may resume your regular activity, but move at a slower pace for the next 24 hours.   Take frequent rest periods for the next 24 hours.   Walking will help get rid of the air and reduce the bloated feeling in your belly (abdomen).   No driving for 24 hours (because of the medicine (anesthesia) used during the test).   You may shower.   Do not sign any important legal documents or operate any machinery for 24 hours (because of the anesthesia used during the test).    NUTRITION  Drink plenty of fluids.   You may resume your normal diet as instructed by your doctor.   Begin with a light meal and progress to your normal diet. Heavy or fried foods are harder to digest and may make you feel sick to your stomach (nauseated).   Avoid alcoholic beverages for 24 hours or as instructed.    MEDICATIONS  You may resume your normal medications.   WHAT YOU CAN  EXPECT TODAY  Some feelings of bloating in the abdomen.   Passage of more gas than usual.    IF YOU HAD A BIOPSY TAKEN DURING THE UPPER ENDOSCOPY:  Eat a soft diet IF YOU HAVE NAUSEA, BLOATING, ABDOMINAL PAIN, OR VOMITING.    FINDING OUT THE RESULTS OF YOUR TEST Not all test results are available during your visit. DR. Oneida Alar WILL CALL YOU WITHIN 14 DAYS OF YOUR PROCEDUE WITH YOUR RESULTS. Do not assume everything is normal if you have not heard from DR. FIELDS, CALL HER OFFICE AT [phone].  SEEK IMMEDIATE MEDICAL ATTENTION AND CALL THE OFFICE: [phone] IF:  You have more than a spotting of blood in your stool.   Your belly is swollen (abdominal distention).   You are nauseated or vomiting.   You have a temperature over 101F.   You have abdominal pain or discomfort that is severe or gets worse throughout the day.   Gastritis  Gastritis is an inflammation (the body's way of reacting to injury and/or infection) of the stomach. It is often caused by viral or bacterial (germ) infections. It can also be caused BY ASPIRIN, BC/GOODY POWDER'S, (IBUPROFEN) MOTRIN, OR ALEVE (NAPROXEN), chemicals (including alcohol), SPICY FOODS, and medications. This illness may be associated with generalized malaise (feeling tired, not well), UPPER ABDOMINAL STOMACH cramps, and fever. One common bacterial cause of gastritis is an organism known as H. Pylori. This can be  treated with antibiotics.

## 2017-01-11 NOTE — H&P (Signed)
Primary Care Physician:  Unk Pinto, MD Primary Gastroenterologist:  Dr. Oneida Alar  Pre-Procedure History & Physical: HPI:  Angela Smith is a 41 y.o. female here for DYSPEPSIA/RUQ-epigastric pain.  Past Medical History:  Diagnosis Date  . Allergy    ALLERGIC RHINITS  . Fibrocystic breast disease   . Hyperlipidemia    DIET/ HERBAL CONTROLED  . Miscarriage 2006   TWO SEPERATE 4 MONTHS APART  . Seizures ([place])    INFANTILE    Past Surgical History:  Procedure Laterality Date  . DILATION AND CURETTAGE OF UTERUS  2006   MISCARRIAGE  . KNEE ARTHROSCOPY Left    X 2  . LAPAROSCOPIC APPENDECTOMY N/A 08/10/2016   Procedure: APPENDECTOMY LAPAROSCOPIC;  Surgeon: Aviva Signs, MD;  Location: [facility];  Service: General;  Laterality: N/A;    Prior to Admission medications   Medication Sig Start Date End Date Taking? Authorizing Provider  ibuprofen (ADVIL,MOTRIN) 200 MG tablet Take 400 mg by mouth every 6 (six) hours as needed for headache, moderate pain or cramping.   Yes [provider]  Levonorgestrel (MIRENA IU) 1 each by Intrauterine route once.    Yes [provider]  pantoprazole (PROTONIX) 40 MG tablet Take 1 tablet (40 mg total) by mouth daily. Take 30 minutes before breakfast daily. Patient not taking: Reported on 01/06/2017 12/09/16 12/09/17  Annitta Needs, NP    Allergies as of 12/09/2016 - Review Complete 12/09/2016  Allergen Reaction Noted  . Dilaudid [hydromorphone] Other (See Comments) 08/10/2016  . Rhinocort [budesonide] Palpitations 11/29/2012    Family History  Problem Relation Age of Onset  . Depression Mother   . Hypertension Father   . Diabetes Maternal Grandmother   . Osteoporosis Maternal Grandmother   . Heart disease Maternal Grandfather   . Hyperlipidemia Maternal Grandfather   . Cancer Maternal Grandfather        PANCREATIC/ BONE  . Cancer Paternal Grandfather        THROAT  . Colon cancer Neg Hx   . Colon polyps Neg Hx      Social History   Socioeconomic History  . Marital status: Married    Spouse name: Not on file  . Number of children: Not on file  . Years of education: Not on file  . Highest education level: Not on file  Social Needs  . Financial resource strain: Not on file  . Food insecurity - worry: Not on file  . Food insecurity - inability: Not on file  . Transportation needs - medical: Not on file  . Transportation needs - non-medical: Not on file  Occupational History  . 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 Smoking status: Never Smoker  . Smokeless tobacco: Never Used  Substance and Sexual Activity  . Alcohol use: No  . Drug use: No  . Sexual activity: Yes    Birth control/protection: IUD  Other Topics Concern  . Not on file  Social History Narrative  . Not on file    Review of Systems: See HPI, otherwise negative ROS   Physical Exam: BP 104/79   Pulse 84   Temp 98.6 F (37 C) (Oral)   Resp 18   Ht 5\' 9"  (1.753 m)   Wt 160 lb (72.6 kg)   SpO2 100%   BMI 23.63 kg/m  General:   Alert,  pleasant and cooperative in NAD Head:  Normocephalic and atraumatic. Neck:  Supple; Lungs:  Clear throughout to auscultation.    Heart:  Regular rate and rhythm. Abdomen:  Soft, nontender and nondistended. Normal bowel sounds, without guarding, and without rebound.   Neurologic:  Alert and  oriented x4;  grossly normal neurologically.  Impression/Plan:     DYSPEPSIA/RUQ-epigastric pain  PLAN:  EGD TODAY. DISCUSSED PROCEDURE, BENEFITS, & RISKS: < 1% chance of medication reaction, bleeding, perforation, or rupture of spleen/liver.

## 2017-01-12 ENCOUNTER — Telehealth: Payer: Self-pay | Admitting: *Deleted

## 2017-01-12 ENCOUNTER — Other Ambulatory Visit: Payer: Self-pay | Admitting: *Deleted

## 2017-01-12 DIAGNOSIS — R1011 Right upper quadrant pain: Secondary | ICD-10-CM

## 2017-01-12 NOTE — Progress Notes (Signed)
PT is aware and OK to schedule the HIDA SCAN.

## 2017-01-12 NOTE — Telephone Encounter (Signed)
[facility] and spoke with John P. Was advised no precert was required for HIDA Scan. No reference # was given.

## 2017-01-13 ENCOUNTER — Encounter (HOSPITAL_COMMUNITY): Payer: Self-pay | Admitting: Gastroenterology

## 2017-01-20 ENCOUNTER — Other Ambulatory Visit (HOSPITAL_COMMUNITY): Payer: BLUE CROSS/BLUE SHIELD

## 2017-01-21 ENCOUNTER — Encounter (HOSPITAL_COMMUNITY): Payer: Self-pay

## 2017-01-21 ENCOUNTER — Encounter (HOSPITAL_COMMUNITY)
Admission: RE | Admit: 2017-01-21 | Discharge: 2017-01-21 | Disposition: A | Discharge status: Home / Self Care | Payer: BLUE CROSS/BLUE SHIELD | Source: Ambulatory Visit | Attending: Gastroenterology | Admitting: Gastroenterology

## 2017-01-21 DIAGNOSIS — R1011 Right upper quadrant pain: Secondary | ICD-10-CM | POA: Diagnosis present

## 2017-01-21 MED ORDER — TECHNETIUM TC 99M MEBROFENIN IV KIT
5.0000 | PACK | Freq: Once | INTRAVENOUS | Status: AC | PRN
  Administered 2017-01-21: 5 via INTRAVENOUS

## 2017-01-25 ENCOUNTER — Telehealth: Payer: Self-pay | Admitting: Gastroenterology

## 2017-01-25 NOTE — Telephone Encounter (Signed)
EF is normal at 80% (normal is greater than 33% with oral agent that she did). I don't believe she had symptoms with the HIDA. How is she doing?

## 2017-01-25 NOTE — Telephone Encounter (Signed)
Pt was calling to see if her Hida Scan results were back yet. Please call her at [phone]

## 2017-01-25 NOTE — Telephone Encounter (Signed)
Forwarding to Anna Boone, NP for results.  

## 2017-01-25 NOTE — Telephone Encounter (Signed)
I spoke to pt and she said she is having a good day today. She said she did experience some pain in her abdomen between her rib cage for about 20 min during the scan.  However, for the most part she has pain daily, sometimes it will be constant for 4 hrs and other times it is intermittent. She does have some nausea but no vomiting. Please advise!

## 2017-01-26 NOTE — Progress Notes (Signed)
cc'd to pcp 

## 2017-01-26 NOTE — Telephone Encounter (Signed)
PLEASE CALL PT. SHE SHOULD AVOID THINGS THAT CAUSE BLOATING AND GAS. SHE CAN LOOK INFORMATION UP ON [email or web address] OR WE CAN MAIL HER A COPY.  BLOATING AND GAS PREVENTION  Although gas may be uncomfortable and embarrassing, it is not life-threatening. Understanding causes, ways to reduce symptoms, and treatment will help most people find some relief.  Points to remember . Everyone has gas in the digestive tract. Marland Kitchen People often believe normal passage of gas to be excessive. . Gas comes from two main sources: swallowed air and normal breakdown of certain foods by harmless bacteria naturally present in the large intestine. . Many foods with carbohydrates can cause gas. Fats and proteins cause little gas.  . Foods that may cause gas include o beans  o vegetables, such as broccoli, cabbage, brussels sprouts, onions, artichokes, and asparagus  o fruits, such as pears, apples, and peaches  o whole grains, such as whole wheat and bran  o soft drinks and fruit drinks  o milk and milk products, such as cheese and ice cream, and packaged foods prepared with lactose, such as bread, cereal, and salad dressing  o foods containing sorbitol, such as dietetic foods and sugar free candies and gums  . The most common symptoms of gas are belching, flatulence, bloating, and abdominal pain. However, some of these symptoms are often caused by an intestinal disorder, such as irritable bowel syndrome, rather than too much gas. . The most common ways to reduce the discomfort of gas are changing diet, taking nonprescription medicines(SIMETHICONE), and reducing the amount of air swallowed. . Digestive enzymes, such as lactase supplements, actually help digest carbohydrates and may allow people to eat foods that normally cause gas.   HIDA RESULTS COMMUNICATED IN DEC 17.

## 2017-01-26 NOTE — Progress Notes (Signed)
Pt is aware, and also aware of the note I sent to Dr. Oneida Alar, and that I will call her back with recommendations from Dr. Oneida Alar. ( See phone note dated 01/25/2017).

## 2017-01-26 NOTE — Telephone Encounter (Signed)
Pt also would like for Dr. Oneida Alar to know that one of her worst problems now is a lot of gas in the form of burping. Worse at night, but she does eat evening meal about 5:00 pm.

## 2017-01-27 NOTE — Telephone Encounter (Signed)
PT is aware and I am putting the handout in mail for her. She said her husband mentioned that Dr. Oneida Alar said when she had her procedure that it was possible that she might have some kind of a virus and could take an antibiotic and get rid of it. She is interested and wants to know if she could try something.

## 2017-01-28 NOTE — Telephone Encounter (Signed)
PLEASE CALL PT. SHE DOES NOT HAVE VIRUS OR BACTERIA IN HER STOMACH.

## 2017-01-28 NOTE — Telephone Encounter (Signed)
Pt is aware.  

## 2017-01-28 NOTE — Telephone Encounter (Signed)
LMOM for a return call.  

## 2017-04-06 ENCOUNTER — Other Ambulatory Visit: Payer: Self-pay | Admitting: Gastroenterology

## 2017-04-24 LAB — HM MAMMOGRAPHY: HM MAMMO: NORMAL (ref 0–4)

## 2017-04-24 LAB — HM PAP SMEAR

## 2017-05-12 ENCOUNTER — Ambulatory Visit (INDEPENDENT_AMBULATORY_CARE_PROVIDER_SITE_OTHER): Payer: BLUE CROSS/BLUE SHIELD | Admitting: Gastroenterology

## 2017-05-12 ENCOUNTER — Encounter: Payer: Self-pay | Admitting: Gastroenterology

## 2017-05-12 VITALS — BP 120/69 | HR 89 | Temp 98.1°F | Ht 69.0 in | Wt 172.0 lb

## 2017-05-12 DIAGNOSIS — R1013 Epigastric pain: Secondary | ICD-10-CM

## 2017-05-12 NOTE — Progress Notes (Signed)
Primary Care Physician:  Unk Pinto, MD  Primary GI: Dr. Oneida Alar    Chief Complaint  Patient presents with  . Abdominal Pain    all over, comes/goes, eating veggies makes worse  . gas/belching    HPI:   Angela Smith is a 42 y.o. female presenting today with a history of chronic abdominal pain. She underwent laparoscopic appendectomy for perforated appendicitis in July 2018. EGD recently with mild gastritis, normal small bowel. US abdomen normal. HIDA within normal limits, with EF 80%. CT Oct 2018 with expected post-op changes.   Her weight has increased from when last seen.  She has started probiotics and digestive enzymes. She notes recurrence of abdominal pain after running out of digestive enzymes. Since resuming this, she is doing well from a pain standpoint. Eating any type of veggies or salads, regardless of preparation, causes significant burping. She feels like she is burping constantly. She stopped her PPI for a few days and noted increase in pain and symptoms, so she resumed it. She does note that she had RUQ discomfort with HIDA scan. No lower GI symptoms, denying rectal bleeding, constipation, diarrhea. She states she was ready to have her gallbladder out last year. She is frustrated with her symptoms and wants help.    Past Medical History:  Diagnosis Date  . Allergy    ALLERGIC RHINITS  . Fibrocystic breast disease   . Hyperlipidemia    DIET/ HERBAL CONTROLED  . Miscarriage 2006   TWO SEPERATE 4 MONTHS APART  . Seizures ([place])    INFANTILE    Past Surgical History:  Procedure Laterality Date  . DILATION AND CURETTAGE OF UTERUS  2006   MISCARRIAGE  . ESOPHAGOGASTRODUODENOSCOPY N/A 01/11/2017   mild gastritis, normal small bowel  . KNEE ARTHROSCOPY Left    X 2  . LAPAROSCOPIC APPENDECTOMY N/A 08/10/2016   Procedure: APPENDECTOMY LAPAROSCOPIC;  Surgeon: Aviva Signs, MD;  Location: [facility];  Service: General;  Laterality: N/A;    Current Outpatient  Medications  Medication Sig Dispense Refill  . acidophilus (RISAQUAD) CAPS capsule Take 1 capsule by mouth daily.    . Digestive Enzymes (DIGESTIVE ENZYME PO) Take by mouth daily.    Marland Kitchen ibuprofen (ADVIL,MOTRIN) 200 MG tablet Take 400 mg by mouth every 6 (six) hours as needed for headache, moderate pain or cramping.    . Levonorgestrel (MIRENA IU) 1 each by Intrauterine route once.     . pantoprazole (PROTONIX) 40 MG tablet TAKE ONE TABLET BY MOUTH DAILY 30 MINUTES BEFORE BREAKFAST 30 tablet 1   No current facility-administered medications for this visit.     Allergies as of 05/12/2017 - Review Complete 05/12/2017  Allergen Reaction Noted  . Dilaudid [hydromorphone] Other (See Comments) 08/10/2016  . Rhinocort [budesonide] Palpitations and Other (See Comments) 11/29/2012    Family History  Problem Relation Age of Onset  . Depression Mother   . Hypertension Father   . Diabetes Maternal Grandmother   . Osteoporosis Maternal Grandmother   . Heart disease Maternal Grandfather   . Hyperlipidemia Maternal Grandfather   . Cancer Maternal Grandfather        PANCREATIC/ BONE  . Cancer Paternal Grandfather        THROAT  . Colon cancer Neg Hx   . Colon polyps Neg Hx     Social History   Socioeconomic History  . Marital status: Married    Spouse name: Not on file  . Number of children: Not on file  .  Years of education: Not on file  . Highest education level: Not on file  Occupational History  . 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 Financial resource strain: Not on file  . Food insecurity:    Worry: Not on file    Inability: Not on file  . Transportation needs:    Medical: Not on file    Non-medical: Not on file  Tobacco Use  . Smoking status: Never Smoker  . Smokeless tobacco: Never Used  Substance and Sexual Activity  . Alcohol use: No  . Drug use: No  . Sexual activity: Yes    Birth control/protection: IUD  Lifestyle  .  Physical activity:    Days per week: Not on file    Minutes per session: Not on file  . Stress: Not on file  Relationships  . Social connections:    Talks on phone: Not on file    Gets together: Not on file    Attends religious service: Not on file    Active member of club or organization: Not on file    Attends meetings of clubs or organizations: Not on file    Relationship status: Not on file  Other Topics Concern  . Not on file  Social History Narrative  . Not on file    Review of Systems: Gen: Denies fever, chills, anorexia. Denies fatigue, weakness, weight loss.  CV: Denies chest pain, palpitations, syncope, peripheral edema, and claudication. Resp: Denies dyspnea at rest, cough, wheezing, coughing up blood, and pleurisy. GI: see HPI  Derm: Denies rash, itching, dry skin Psych: Denies depression, anxiety, memory loss, confusion. No homicidal or suicidal ideation.  Heme: Denies bruising, bleeding, and enlarged lymph nodes.  Physical Exam: BP 120/69   Pulse 89   Temp 98.1 F (36.7 C) (Oral)   Ht 5\' 9"  (1.753 m)   Wt 172 lb (78 kg)   LMP  (Approximate) Comment: 10 years ago  BMI 25.40 kg/m  General:   Alert and oriented. No distress noted. Pleasant and cooperative.  Head:  Normocephalic and atraumatic. Eyes:  Conjuctiva clear without scleral icterus. Mouth:  Oral mucosa pink and moist.  Abdomen:  +BS, soft, non-tender and non-distended. No rebound or guarding. No HSM or masses noted. Msk:  Symmetrical without gross deformities. Normal posture. Extremities:  Without edema. Neurologic:  Alert and  oriented x4 Psych:  Alert and cooperative. Normal mood and affect.

## 2017-05-12 NOTE — Patient Instructions (Signed)
I would like for you to stop the Protonix and instead start Dexilant once each day.   Please let me know in about 10-14 days how you are doing. We will then see if we need to have you go back to Dr. Arnoldo Morale for evaluation.   It was a pleasure to see you today. I strive to create trusting relationships with patients to provide genuine, compassionate, and quality care. I value your feedback. If you receive a survey regarding your visit,  I greatly appreciate you taking time to fill this out.   Annitta Needs, PhD, ANP-BC [facility]

## 2017-05-12 NOTE — Assessment & Plan Note (Signed)
42 year old female with chronic abdominal pain, s/p EGD with mild gastritis and normal small bowel. US abdomen normal and HIDA within normal limits and EF of 80%. She does note RUQ pain with HIDA scan. CT on file from Oct 2018. Pain has improved with probiotics and more specifically digestive enzymes. She notes the most aggravating symptom is persistent belching, worsened with veggies and salads. She has gained weight but attributes this to eating more starchy foods that do not cause these symptoms. Previous cessation of PPI with worsening of symptoms, so she has resumed this.  Will trial Dexilant samples. She has no concerning lower GI symptoms. Gallbladder remains in situ but she is without typical postprandial abdominal pain, N/V. Persistent belching remains an issue. Thus far, biliary evaluation unimpressive although she did have reproduction of symptoms with HIDA scan. We discussed in detail that even with cholecystectomy, she could still have these issues. She is to call with update in a few weeks after trial of Dexilant.

## 2017-05-12 NOTE — Progress Notes (Signed)
CC'D TO PCP °

## 2017-05-19 ENCOUNTER — Encounter: Payer: Self-pay | Admitting: Gastroenterology

## 2017-05-21 ENCOUNTER — Telehealth: Payer: Self-pay | Admitting: Gastroenterology

## 2017-05-21 DIAGNOSIS — R109 Unspecified abdominal pain: Secondary | ICD-10-CM

## 2017-05-21 NOTE — Telephone Encounter (Signed)
Referral sent. They will contact patient with an appointment

## 2017-05-21 NOTE — Telephone Encounter (Signed)
[facility] clinical pool: please refer patient back to Dr. Arnoldo Morale. She is established with him already. We have evaluated with EGD, US abdomen, HIDA. Noted reproduction of symptoms with HIDA but EF was normal. May not be a candidate for cholecystectomy as it is quite possible she could still have symptoms after, but please have her follow-up with Dr. Arnoldo Morale. Differential includes non-ulcer dyspepsia. Continue PPI, which she is aware. I told her we would try to initiate the referral back as she had trouble getting an appt.

## 2017-06-01 ENCOUNTER — Encounter: Payer: Self-pay | Admitting: General Surgery

## 2017-06-01 ENCOUNTER — Ambulatory Visit (INDEPENDENT_AMBULATORY_CARE_PROVIDER_SITE_OTHER): Payer: BLUE CROSS/BLUE SHIELD | Admitting: General Surgery

## 2017-06-01 DIAGNOSIS — K811 Chronic cholecystitis: Secondary | ICD-10-CM | POA: Diagnosis not present

## 2017-06-01 NOTE — H&P (Signed)
Angela Smith; [id number]; 04-18-1975   HPI Patient is a 42 year old white female who was referred to my care by Roseanne Kaufman of GI for evaluation and treatment of right upper quadrant abdominal pain.  She has been having right upper quadrant abdominal pain radiating to right flank, nausea, and bloating for many months now.  She states it is made worse with vegetables.  She has had an ultrasound which was negative for cholelithiasis.  She had a HIDA scan which revealed a normal gallbladder ejection fraction but recent reducible symptoms with fatty meal.  She has been tried on various medications, which have not been helpful.  She has been sent for consideration of a laparoscopic cholecystectomy.  She denies any fever, chills, or jaundice. Past Medical History:  Diagnosis Date  . Allergy    ALLERGIC RHINITS  . Fibrocystic breast disease   . Hyperlipidemia    DIET/ HERBAL CONTROLED  . Miscarriage 2006   TWO SEPERATE 4 MONTHS APART  . Seizures ([place])    INFANTILE    Past Surgical History:  Procedure Laterality Date  . DILATION AND CURETTAGE OF UTERUS  2006   MISCARRIAGE  . ESOPHAGOGASTRODUODENOSCOPY N/A 01/11/2017   mild gastritis, normal small bowel  . KNEE ARTHROSCOPY Left    X 2  . LAPAROSCOPIC APPENDECTOMY N/A 08/10/2016   Procedure: APPENDECTOMY LAPAROSCOPIC;  Surgeon: Aviva Signs, MD;  Location: [facility];  Service: General;  Laterality: N/A;    Family History  Problem Relation Age of Onset  . Depression Mother   . Hypertension Father   . Diabetes Maternal Grandmother   . Osteoporosis Maternal Grandmother   . Heart disease Maternal Grandfather   . Hyperlipidemia Maternal Grandfather   . Cancer Maternal Grandfather        PANCREATIC/ BONE  . Cancer Paternal Grandfather        THROAT  . Colon cancer Neg Hx   . Colon polyps Neg Hx     Current Outpatient Medications on File Prior to Visit  Medication Sig Dispense Refill  . acidophilus (RISAQUAD) CAPS capsule Take 1 capsule  by mouth daily.    . Digestive Enzymes (DIGESTIVE ENZYME PO) Take by mouth daily.    Marland Kitchen ibuprofen (ADVIL,MOTRIN) 200 MG tablet Take 400 mg by mouth every 6 (six) hours as needed for headache, moderate pain or cramping.    . Levonorgestrel (MIRENA IU) 1 each by Intrauterine route once.     . pantoprazole (PROTONIX) 40 MG tablet TAKE ONE TABLET BY MOUTH DAILY 30 MINUTES BEFORE BREAKFAST 30 tablet 1   No current facility-administered medications on file prior to visit.     Allergies  Allergen Reactions  . Dilaudid [Hydromorphone] Other (See Comments)    Hallucinations   . Rhinocort [Budesonide] Palpitations and Other (See Comments)    Heart palpitations    Social History   Substance and Sexual Activity  Alcohol Use No    Social History   Tobacco Use  Smoking Status Never Smoker  Smokeless Tobacco Never Used    Review of Systems  Constitutional: Positive for malaise/fatigue.  HENT: Negative.   Eyes: Negative.   Respiratory: Negative.   Cardiovascular: Negative.   Gastrointestinal: Positive for abdominal pain, heartburn and nausea.  Genitourinary: Negative.   Musculoskeletal: Negative.   Skin: Negative.   Neurological: Negative.   Endo/Heme/Allergies: Negative.   Psychiatric/Behavioral: Negative.     Objective   There were no vitals filed for this visit.  Physical Exam  Constitutional: She is oriented to person,  place, and time. She appears well-developed and well-nourished.  HENT:  Head: Normocephalic and atraumatic.  Eyes: No scleral icterus.  Cardiovascular: Normal rate, regular rhythm and normal heart sounds. Exam reveals no gallop and no friction rub.  No murmur heard. Pulmonary/Chest: Effort normal and breath sounds normal.  Abdominal: Soft. Bowel sounds are normal. She exhibits no distension and no mass. There is no tenderness. There is no rebound and no guarding.  Neurological: She is alert and oriented to person, place, and time.  Skin: Skin is warm and  dry.  Vitals reviewed. Vicente Males Boone's notes reviewed. U/S and HIDA scan reports reviewed.  Assessment  Chronic cholecystitis Plan   Patient realizes that by taking out her gallbladder, I am cannot assure her that this would be 100% curative of her symptoms.  Patient is scheduled for laparoscopic cholecystectomy on 06/04/2017.  The risks and benefits of the procedure including bleeding, infection, hepatobiliary injury, and the possibility of an open procedure were fully explained to the patient, who gave informed consent.

## 2017-06-01 NOTE — Progress Notes (Signed)
Angela Smith; [id number]; 1976-01-01   HPI Patient is a 42 year old white female who was referred to my care by Roseanne Kaufman of GI for evaluation and treatment of right upper quadrant abdominal pain.  She has been having right upper quadrant abdominal pain radiating to right flank, nausea, and bloating for many months now.  She states it is made worse with vegetables.  She has had an ultrasound which was negative for cholelithiasis.  She had a HIDA scan which revealed a normal gallbladder ejection fraction but recent reducible symptoms with fatty meal.  She has been tried on various medications, which have not been helpful.  She has been sent for consideration of a laparoscopic cholecystectomy.  She denies any fever, chills, or jaundice. Past Medical History:  Diagnosis Date  . Allergy    ALLERGIC RHINITS  . Fibrocystic breast disease   . Hyperlipidemia    DIET/ HERBAL CONTROLED  . Miscarriage 2006   TWO SEPERATE 4 MONTHS APART  . Seizures ([place])    INFANTILE    Past Surgical History:  Procedure Laterality Date  . DILATION AND CURETTAGE OF UTERUS  2006   MISCARRIAGE  . ESOPHAGOGASTRODUODENOSCOPY N/A 01/11/2017   mild gastritis, normal small bowel  . KNEE ARTHROSCOPY Left    X 2  . LAPAROSCOPIC APPENDECTOMY N/A 08/10/2016   Procedure: APPENDECTOMY LAPAROSCOPIC;  Surgeon: Aviva Signs, MD;  Location: [facility];  Service: General;  Laterality: N/A;    Family History  Problem Relation Age of Onset  . Depression Mother   . Hypertension Father   . Diabetes Maternal Grandmother   . Osteoporosis Maternal Grandmother   . Heart disease Maternal Grandfather   . Hyperlipidemia Maternal Grandfather   . Cancer Maternal Grandfather        PANCREATIC/ BONE  . Cancer Paternal Grandfather        THROAT  . Colon cancer Neg Hx   . Colon polyps Neg Hx     Current Outpatient Medications on File Prior to Visit  Medication Sig Dispense Refill  . acidophilus (RISAQUAD) CAPS capsule Take 1 capsule  by mouth daily.    . Digestive Enzymes (DIGESTIVE ENZYME PO) Take by mouth daily.    Marland Kitchen ibuprofen (ADVIL,MOTRIN) 200 MG tablet Take 400 mg by mouth every 6 (six) hours as needed for headache, moderate pain or cramping.    . Levonorgestrel (MIRENA IU) 1 each by Intrauterine route once.     . pantoprazole (PROTONIX) 40 MG tablet TAKE ONE TABLET BY MOUTH DAILY 30 MINUTES BEFORE BREAKFAST 30 tablet 1   No current facility-administered medications on file prior to visit.     Allergies  Allergen Reactions  . Dilaudid [Hydromorphone] Other (See Comments)    Hallucinations   . Rhinocort [Budesonide] Palpitations and Other (See Comments)    Heart palpitations    Social History   Substance and Sexual Activity  Alcohol Use No    Social History   Tobacco Use  Smoking Status Never Smoker  Smokeless Tobacco Never Used    Review of Systems  Constitutional: Positive for malaise/fatigue.  HENT: Negative.   Eyes: Negative.   Respiratory: Negative.   Cardiovascular: Negative.   Gastrointestinal: Positive for abdominal pain, heartburn and nausea.  Genitourinary: Negative.   Musculoskeletal: Negative.   Skin: Negative.   Neurological: Negative.   Endo/Heme/Allergies: Negative.   Psychiatric/Behavioral: Negative.     Objective   There were no vitals filed for this visit.  Physical Exam  Constitutional: She is oriented to person,  place, and time. She appears well-developed and well-nourished.  HENT:  Head: Normocephalic and atraumatic.  Eyes: No scleral icterus.  Cardiovascular: Normal rate, regular rhythm and normal heart sounds. Exam reveals no gallop and no friction rub.  No murmur heard. Pulmonary/Chest: Effort normal and breath sounds normal.  Abdominal: Soft. Bowel sounds are normal. She exhibits no distension and no mass. There is no tenderness. There is no rebound and no guarding.  Neurological: She is alert and oriented to person, place, and time.  Skin: Skin is warm and  dry.  Vitals reviewed. Vicente Males Boone's notes reviewed. U/S and HIDA scan reports reviewed.  Assessment  Chronic cholecystitis Plan   Patient realizes that by taking out her gallbladder, I am cannot assure her that this would be 100% curative of her symptoms.  Patient is scheduled for laparoscopic cholecystectomy on 06/04/2017.  The risks and benefits of the procedure including bleeding, infection, hepatobiliary injury, and the possibility of an open procedure were fully explained to the patient, who gave informed consent.

## 2017-06-01 NOTE — Patient Instructions (Signed)

## 2017-06-02 ENCOUNTER — Other Ambulatory Visit: Payer: Self-pay

## 2017-06-02 ENCOUNTER — Encounter (HOSPITAL_COMMUNITY): Payer: Self-pay

## 2017-06-02 ENCOUNTER — Encounter (HOSPITAL_COMMUNITY)
Admission: RE | Admit: 2017-06-02 | Discharge: 2017-06-02 | Disposition: A | Discharge status: Home / Self Care | Payer: BLUE CROSS/BLUE SHIELD | Source: Ambulatory Visit | Attending: General Surgery | Admitting: General Surgery

## 2017-06-02 DIAGNOSIS — Z01818 Encounter for other preprocedural examination: Secondary | ICD-10-CM | POA: Insufficient documentation

## 2017-06-02 LAB — CBC WITH DIFFERENTIAL/PLATELET
BASOS PCT: 0 %
Basophils Absolute: 0 10*3/uL (ref 0.0–0.1)
EOS ABS: 0.3 10*3/uL (ref 0.0–0.7)
Eosinophils Relative: 4 %
HCT: 38.1 % (ref 36.0–46.0)
Hemoglobin: 12.3 g/dL (ref 12.0–15.0)
Lymphocytes Relative: 26 %
Lymphs Abs: 1.8 10*3/uL (ref 0.7–4.0)
MCH: 30.8 pg (ref 26.0–34.0)
MCHC: 32.3 g/dL (ref 30.0–36.0)
MCV: 95.5 fL (ref 78.0–100.0)
MONO ABS: 0.5 10*3/uL (ref 0.1–1.0)
Monocytes Relative: 6 %
Neutro Abs: 4.6 10*3/uL (ref 1.7–7.7)
Neutrophils Relative %: 64 %
Platelets: 358 10*3/uL (ref 150–400)
RBC: 3.99 MIL/uL (ref 3.87–5.11)
RDW: 12.9 % (ref 11.5–15.5)
WBC: 7.1 10*3/uL (ref 4.0–10.5)

## 2017-06-02 LAB — COMPREHENSIVE METABOLIC PANEL
ALBUMIN: 4.1 g/dL (ref 3.5–5.0)
ALT: 15 U/L (ref 14–54)
ANION GAP: 8 (ref 5–15)
AST: 17 U/L (ref 15–41)
Alkaline Phosphatase: 64 U/L (ref 38–126)
BILIRUBIN TOTAL: 0.8 mg/dL (ref 0.3–1.2)
BUN: 17 mg/dL (ref 6–20)
CO2: 26 mmol/L (ref 22–32)
Calcium: 9.2 mg/dL (ref 8.9–10.3)
Chloride: 102 mmol/L (ref 101–111)
Creatinine, Ser: 0.78 mg/dL (ref 0.44–1.00)
GFR calc Af Amer: >60 mL/min (ref >60)
GFR calc non Af Amer: >60 mL/min (ref >60)
GLUCOSE: 82 mg/dL (ref 65–99)
POTASSIUM: 3.9 mmol/L (ref 3.5–5.1)
SODIUM: 136 mmol/L (ref 135–145)
TOTAL PROTEIN: 7.4 g/dL (ref 6.5–8.1)

## 2017-06-02 LAB — HCG, SERUM, QUALITATIVE: Preg, Serum: NEGATIVE

## 2017-06-02 NOTE — Patient Instructions (Signed)
Angela Smith  06/02/2017     @PREFPERIOPPHARMACY @   Your procedure is scheduled on  06/04/2017   Report to [facility] at  7  A.M.  Call this number if you have problems the morning of surgery:  [phone]   Remember:  Do not eat food or drink liquids after midnight.  Take these medicines the morning of surgery with A SIP OF WATER  protonix.   Do not wear jewelry, make-up or nail polish.  Do not wear lotions, powders, or perfumes, or deodorant.  Do not shave 48 hours prior to surgery.  Men may shave face and neck.  Do not bring valuables to the hospital.  [facility] is not responsible for any belongings or valuables.  Contacts, dentures or bridgework may not be worn into surgery.  Leave your suitcase in the car.  After surgery it may be brought to your room.  For patients admitted to the hospital, discharge time will be determined by your treatment team.  Patients discharged the day of surgery will not be allowed to drive home.   Name and phone number of your driver:   family Special instructions:  None  Please read over the following fact sheets that you were given. Anesthesia Post-op Instructions and Care and Recovery After Surgery       Laparoscopic Cholecystectomy Laparoscopic cholecystectomy is surgery to remove the gallbladder. The gallbladder is a pear-shaped organ that lies beneath the liver on the right side of the body. The gallbladder stores bile, which is a fluid that helps the body to digest fats. Cholecystectomy is often done for inflammation of the gallbladder (cholecystitis). This condition is usually caused by a buildup of gallstones (cholelithiasis) in the gallbladder. Gallstones can block the flow of bile, which can result in inflammation and pain. In severe cases, emergency surgery may be required. This procedure is done though small incisions in your abdomen (laparoscopic surgery). A thin scope with a camera (laparoscope) is  inserted through one incision. Thin surgical instruments are inserted through the other incisions. In some cases, a laparoscopic procedure may be turned into a type of surgery that is done through a larger incision (open surgery). Tell a health care provider about:  Any allergies you have.  All medicines you are taking, including vitamins, herbs, eye drops, creams, and over-the-counter medicines.  Any problems you or family members have had with anesthetic medicines.  Any blood disorders you have.  Any surgeries you have had.  Any medical conditions you have.  Whether you are pregnant or may be pregnant. What are the risks? Generally, this is a safe procedure. However, problems may occur, including:  Infection.  Bleeding.  Allergic reactions to medicines.  Damage to other structures or organs.  A stone remaining in the common bile duct. The common bile duct carries bile from the gallbladder into the small intestine.  A bile leak from the cyst duct that is clipped when your gallbladder is removed.  What happens before the procedure? Staying hydrated Follow instructions from your health care provider about hydration, which may include:  Up to 2 hours before the procedure - you may continue to drink clear liquids, such as water, clear fruit juice, black coffee, and plain tea.  Eating and drinking restrictions Follow instructions from your health care provider about eating and drinking, which may include:  8 hours before the procedure - stop eating heavy meals or foods such as meat,  fried foods, or fatty foods.  6 hours before the procedure - stop eating light meals or foods, such as toast or cereal.  6 hours before the procedure - stop drinking milk or drinks that contain milk.  2 hours before the procedure - stop drinking clear liquids.  Medicines  Ask your health care provider about: ? Changing or stopping your regular medicines. This is especially important if you  are taking diabetes medicines or blood thinners. ? Taking medicines such as aspirin and ibuprofen. These medicines can thin your blood. Do not take these medicines before your procedure if your health care provider instructs you not to.  You may be given antibiotic medicine to help prevent infection. General instructions  Let your health care provider know if you develop a cold or an infection before surgery.  Plan to have someone take you home from the hospital or clinic.  Ask your health care provider how your surgical site will be marked or identified. What happens during the procedure?  To reduce your risk of infection: ? Your health care team will wash or sanitize their hands. ? Your skin will be washed with soap. ? Hair may be removed from the surgical area.  An IV tube may be inserted into one of your veins.  You will be given one or more of the following: ? A medicine to help you relax (sedative). ? A medicine to make you fall asleep (general anesthetic).  A breathing tube will be placed in your mouth.  Your surgeon will make several small cuts (incisions) in your abdomen.  The laparoscope will be inserted through one of the small incisions. The camera on the laparoscope will send images to a TV screen (monitor) in the operating room. This lets your surgeon see inside your abdomen.  Air-like gas will be pumped into your abdomen. This will expand your abdomen to give the surgeon more room to perform the surgery.  Other tools that are needed for the procedure will be inserted through the other incisions. The gallbladder will be removed through one of the incisions.  Your common bile duct may be examined. If stones are found in the common bile duct, they may be removed.  After your gallbladder has been removed, the incisions will be closed with stitches (sutures), staples, or skin glue.  Your incisions may be covered with a bandage (dressing). The procedure may vary among  health care providers and hospitals. What happens after the procedure?  Your blood pressure, heart rate, breathing rate, and blood oxygen level will be monitored until the medicines you were given have worn off.  You will be given medicines as needed to control your pain.  Do not drive for 24 hours if you were given a sedative. This information is not intended to replace advice given to you by your health care provider. Make sure you discuss any questions you have with your health care provider. Document Released: 01/26/2005 Document Revised: 08/18/2015 Document Reviewed: 07/15/2015 Elsevier Interactive Patient Education  2018 [employer].  Laparoscopic Cholecystectomy, Care After This sheet gives you information about how to care for yourself after your procedure. Your health care provider may also give you more specific instructions. If you have problems or questions, contact your health care provider. What can I expect after the procedure? After the procedure, it is common to have:  Pain at your incision sites. You will be given medicines to control this pain.  Mild nausea or vomiting.  Bloating and possible shoulder  pain from the air-like gas that was used during the procedure.  Follow these instructions at home: Incision care   Follow instructions from your health care provider about how to take care of your incisions. Make sure you: ? Wash your hands with soap and water before you change your bandage (dressing). If soap and water are not available, use hand sanitizer. ? Change your dressing as told by your health care provider. ? Leave stitches (sutures), skin glue, or adhesive strips in place. These skin closures may need to be in place for 2 weeks or longer. If adhesive strip edges start to loosen and curl up, you may trim the loose edges. Do not remove adhesive strips completely unless your health care provider tells you to do that.  Do not take baths, swim, or use a hot  tub until your health care provider approves. Ask your health care provider if you can take showers. You may only be allowed to take sponge baths for bathing.  Check your incision area every day for signs of infection. Check for: ? More redness, swelling, or pain. ? More fluid or blood. ? Warmth. ? Pus or a bad smell. Activity  Do not drive or use heavy machinery while taking prescription pain medicine.  Do not lift anything that is heavier than 10 lb (4.5 kg) until your health care provider approves.  Do not play contact sports until your health care provider approves.  Do not drive for 24 hours if you were given a medicine to help you relax (sedative).  Rest as needed. Do not return to work or school until your health care provider approves. General instructions  Take over-the-counter and prescription medicines only as told by your health care provider.  To prevent or treat constipation while you are taking prescription pain medicine, your health care provider may recommend that you: ? Drink enough fluid to keep your urine clear or pale yellow. ? Take over-the-counter or prescription medicines. ? Eat foods that are high in fiber, such as fresh fruits and vegetables, whole grains, and beans. ? Limit foods that are high in fat and processed sugars, such as fried and sweet foods. Contact a health care provider if:  You develop a rash.  You have more redness, swelling, or pain around your incisions.  You have more fluid or blood coming from your incisions.  Your incisions feel warm to the touch.  You have pus or a bad smell coming from your incisions.  You have a fever.  One or more of your incisions breaks open. Get help right away if:  You have trouble breathing.  You have chest pain.  You have increasing pain in your shoulders.  You faint or feel dizzy when you stand.  You have severe pain in your abdomen.  You have nausea or vomiting that lasts for more than  one day.  You have leg pain. This information is not intended to replace advice given to you by your health care provider. Make sure you discuss any questions you have with your health care provider. Document Released: 01/26/2005 Document Revised: 08/17/2015 Document Reviewed: 07/15/2015 Elsevier Interactive Patient Education  2018 [employer] Anesthesia, Adult General anesthesia is the use of medicines to make a person "go to sleep" (be unconscious) for a medical procedure. General anesthesia is often recommended when a procedure:  Is long.  Requires you to be still or in an unusual position.  Is major and can cause you to lose blood.  Is impossible to do without general anesthesia.  The medicines used for general anesthesia are called general anesthetics. In addition to making you sleep, the medicines:  Prevent pain.  Control your blood pressure.  Relax your muscles.  Tell a health care provider about:  Any allergies you have.  All medicines you are taking, including vitamins, herbs, eye drops, creams, and over-the-counter medicines.  Any problems you or family members have had with anesthetic medicines.  Types of anesthetics you have had in the past.  Any bleeding disorders you have.  Any surgeries you have had.  Any medical conditions you have.  Any history of heart or lung conditions, such as heart failure, sleep apnea, or chronic obstructive pulmonary disease (COPD).  Whether you are pregnant or may be pregnant.  Whether you use tobacco, alcohol, marijuana, or street drugs.  Any history of Armed forces logistics/support/administrative officer.  Any history of depression or anxiety. What are the risks? Generally, this is a safe procedure. However, problems may occur, including:  Allergic reaction to anesthetics.  Lung and heart problems.  Inhaling food or liquids from your stomach into your lungs (aspiration).  Injury to nerves.  Waking up during your procedure and being  unable to move (rare).  Extreme agitation or a state of mental confusion (delirium) when you wake up from the anesthetic.  Air in the bloodstream, which can lead to stroke.  These problems are more likely to develop if you are having a major surgery or if you have an advanced medical condition. You can prevent some of these complications by answering all of your health care provider's questions thoroughly and by following all pre-procedure instructions. General anesthesia can cause side effects, including:  Nausea or vomiting  A sore throat from the breathing tube.  Feeling cold or shivery.  Feeling tired, washed out, or achy.  Sleepiness or drowsiness.  Confusion or agitation.  What happens before the procedure? Staying hydrated Follow instructions from your health care provider about hydration, which may include:  Up to 2 hours before the procedure - you may continue to drink clear liquids, such as water, clear fruit juice, black coffee, and plain tea.  Eating and drinking restrictions Follow instructions from your health care provider about eating and drinking, which may include:  8 hours before the procedure - stop eating heavy meals or foods such as meat, fried foods, or fatty foods.  6 hours before the procedure - stop eating light meals or foods, such as toast or cereal.  6 hours before the procedure - stop drinking milk or drinks that contain milk.  2 hours before the procedure - stop drinking clear liquids.  Medicines  Ask your health care provider about: ? Changing or stopping your regular medicines. This is especially important if you are taking diabetes medicines or blood thinners. ? Taking medicines such as aspirin and ibuprofen. These medicines can thin your blood. Do not take these medicines before your procedure if your health care provider instructs you not to. ? Taking new dietary supplements or medicines. Do not take these during the week before your  procedure unless your health care provider approves them.  If you are told to take a medicine or to continue taking a medicine on the day of the procedure, take the medicine with sips of water. General instructions   Ask if you will be going home the same day, the following day, or after a longer hospital stay. ? Plan to have someone take you home. ? Plan  to have someone stay with you for the first 24 hours after you leave the hospital or clinic.  For 3-6 weeks before the procedure, try not to use any tobacco products, such as cigarettes, chewing tobacco, and e-cigarettes.  You may brush your teeth on the morning of the procedure, but make sure to spit out the toothpaste. What happens during the procedure?  You will be given anesthetics through a mask and through an IV tube in one of your veins.  You may receive medicine to help you relax (sedative).  As soon as you are asleep, a breathing tube may be used to help you breathe.  An anesthesia specialist will stay with you throughout the procedure. He or she will help keep you comfortable and safe by continuing to give you medicines and adjusting the amount of medicine that you get. He or she will also watch your blood pressure, pulse, and oxygen levels to make sure that the anesthetics do not cause any problems.  If a breathing tube was used to help you breathe, it will be removed before you wake up. The procedure may vary among health care providers and hospitals. What happens after the procedure?  You will wake up, often slowly, after the procedure is complete, usually in a recovery area.  Your blood pressure, heart rate, breathing rate, and blood oxygen level will be monitored until the medicines you were given have worn off.  You may be given medicine to help you calm down if you feel anxious or agitated.  If you will be going home the same day, your health care provider may check to make sure you can stand, drink, and  urinate.  Your health care providers will treat your pain and side effects before you go home.  Do not drive for 24 hours if you received a sedative.  You may: ? Feel nauseous and vomit. ? Have a sore throat. ? Have mental slowness. ? Feel cold or shivery. ? Feel sleepy. ? Feel tired. ? Feel sore or achy, even in parts of your body where you did not have surgery. This information is not intended to replace advice given to you by your health care provider. Make sure you discuss any questions you have with your health care provider. Document Released: 05/05/2007 Document Revised: 07/09/2015 Document Reviewed: 01/10/2015 Elsevier Interactive Patient Education  2018 [employer] Anesthesia, Adult, Care After These instructions provide you with information about caring for yourself after your procedure. Your health care provider may also give you more specific instructions. Your treatment has been planned according to current medical practices, but problems sometimes occur. Call your health care provider if you have any problems or questions after your procedure. What can I expect after the procedure? After the procedure, it is common to have:  Vomiting.  A sore throat.  Mental slowness.  It is common to feel:  Nauseous.  Cold or shivery.  Sleepy.  Tired.  Sore or achy, even in parts of your body where you did not have surgery.  Follow these instructions at home: For at least 24 hours after the procedure:  Do not: ? Participate in activities where you could fall or become injured. ? Drive. ? Use heavy machinery. ? Drink alcohol. ? Take sleeping pills or medicines that cause drowsiness. ? Make important decisions or sign legal documents. ? Take care of children on your own.  Rest. Eating and drinking  If you vomit, drink water, juice, or soup when you can  drink without vomiting.  Drink enough fluid to keep your urine clear or pale yellow.  Make sure you  have little or no nausea before eating solid foods.  Follow the diet recommended by your health care provider. General instructions  Have a responsible adult stay with you until you are awake and alert.  Return to your normal activities as told by your health care provider. Ask your health care provider what activities are safe for you.  Take over-the-counter and prescription medicines only as told by your health care provider.  If you smoke, do not smoke without supervision.  Keep all follow-up visits as told by your health care provider. This is important. Contact a health care provider if:  You continue to have nausea or vomiting at home, and medicines are not helpful.  You cannot drink fluids or start eating again.  You cannot urinate after 8-12 hours.  You develop a skin rash.  You have fever.  You have increasing redness at the site of your procedure. Get help right away if:  You have difficulty breathing.  You have chest pain.  You have unexpected bleeding.  You feel that you are having a life-threatening or urgent problem. This information is not intended to replace advice given to you by your health care provider. Make sure you discuss any questions you have with your health care provider. Document Released: 05/04/2000 Document Revised: 07/01/2015 Document Reviewed: 01/10/2015 Elsevier Interactive Patient Education  [employer].

## 2017-06-04 ENCOUNTER — Ambulatory Visit (HOSPITAL_COMMUNITY): Payer: BLUE CROSS/BLUE SHIELD | Admitting: Anesthesiology

## 2017-06-04 ENCOUNTER — Encounter (HOSPITAL_COMMUNITY): Payer: Self-pay | Admitting: *Deleted

## 2017-06-04 ENCOUNTER — Ambulatory Visit (HOSPITAL_COMMUNITY)
Admission: RE | Admit: 2017-06-04 | Discharge: 2017-06-04 | Disposition: A | Discharge status: Home / Self Care | Payer: BLUE CROSS/BLUE SHIELD | Source: Ambulatory Visit | Attending: General Surgery | Admitting: General Surgery

## 2017-06-04 ENCOUNTER — Encounter (HOSPITAL_COMMUNITY)
Admission: RE | Disposition: A | Discharge status: Home / Self Care | Payer: Self-pay | Source: Ambulatory Visit | Attending: General Surgery

## 2017-06-04 DIAGNOSIS — L723 Sebaceous cyst: Secondary | ICD-10-CM | POA: Diagnosis not present

## 2017-06-04 DIAGNOSIS — K811 Chronic cholecystitis: Secondary | ICD-10-CM | POA: Insufficient documentation

## 2017-06-04 DIAGNOSIS — E785 Hyperlipidemia, unspecified: Secondary | ICD-10-CM | POA: Diagnosis not present

## 2017-06-04 DIAGNOSIS — Z885 Allergy status to narcotic agent status: Secondary | ICD-10-CM | POA: Diagnosis not present

## 2017-06-04 DIAGNOSIS — Z79899 Other long term (current) drug therapy: Secondary | ICD-10-CM | POA: Insufficient documentation

## 2017-06-04 HISTORY — PX: CHOLECYSTECTOMY: SHX55

## 2017-06-04 SURGERY — LAPAROSCOPIC CHOLECYSTECTOMY
Anesthesia: General | Site: Abdomen

## 2017-06-04 MED ORDER — MIDAZOLAM HCL 2 MG/2ML IJ SOLN
INTRAMUSCULAR | Status: AC
  Filled 2017-06-04: qty 2

## 2017-06-04 MED ORDER — MEPERIDINE HCL 50 MG/ML IJ SOLN
6.2500 mg | INTRAMUSCULAR | Status: DC | PRN
  Administered 2017-06-04 (×2): 12.5 mg via INTRAVENOUS
  Filled 2017-06-04: qty 1

## 2017-06-04 MED ORDER — BUPIVACAINE HCL (PF) 0.5 % IJ SOLN
INTRAMUSCULAR | Status: DC | PRN
  Administered 2017-06-04: 10 mL

## 2017-06-04 MED ORDER — ROCURONIUM BROMIDE 100 MG/10ML IV SOLN
INTRAVENOUS | Status: DC | PRN
  Administered 2017-06-04: 20 mg via INTRAVENOUS

## 2017-06-04 MED ORDER — SODIUM CHLORIDE 0.9 % IR SOLN
Status: DC | PRN
  Administered 2017-06-04: 1000 mL

## 2017-06-04 MED ORDER — CIPROFLOXACIN IN D5W 400 MG/200ML IV SOLN
400.0000 mg | INTRAVENOUS | Status: AC
  Administered 2017-06-04 (×2): 400 mg via INTRAVENOUS

## 2017-06-04 MED ORDER — DIPHENHYDRAMINE HCL 50 MG/ML IJ SOLN: 12.5000 mg | Freq: Once | INTRAMUSCULAR | Status: DC

## 2017-06-04 MED ORDER — PROPOFOL 10 MG/ML IV BOLUS
INTRAVENOUS | Status: AC
  Filled 2017-06-04: qty 20

## 2017-06-04 MED ORDER — FENTANYL CITRATE (PF) 100 MCG/2ML IJ SOLN
INTRAMUSCULAR | Status: DC | PRN
  Administered 2017-06-04: 100 ug via INTRAVENOUS

## 2017-06-04 MED ORDER — CIPROFLOXACIN IN D5W 400 MG/200ML IV SOLN
INTRAVENOUS | Status: AC
  Filled 2017-06-04: qty 200

## 2017-06-04 MED ORDER — KETOROLAC TROMETHAMINE 30 MG/ML IJ SOLN
INTRAMUSCULAR | Status: AC
  Filled 2017-06-04: qty 1

## 2017-06-04 MED ORDER — MIDAZOLAM HCL 5 MG/5ML IJ SOLN
INTRAMUSCULAR | Status: DC | PRN
  Administered 2017-06-04: 2 mg via INTRAVENOUS

## 2017-06-04 MED ORDER — LIDOCAINE HCL 1 % IJ SOLN
INTRAMUSCULAR | Status: DC | PRN
  Administered 2017-06-04: 25 mg via INTRADERMAL

## 2017-06-04 MED ORDER — SUGAMMADEX SODIUM 200 MG/2ML IV SOLN
INTRAVENOUS | Status: AC
  Filled 2017-06-04: qty 2

## 2017-06-04 MED ORDER — ONDANSETRON HCL 4 MG/2ML IJ SOLN
INTRAMUSCULAR | Status: DC | PRN
  Administered 2017-06-04: 4 mg via INTRAVENOUS

## 2017-06-04 MED ORDER — LIDOCAINE HCL (PF) 1 % IJ SOLN
INTRAMUSCULAR | Status: AC
  Filled 2017-06-04: qty 5

## 2017-06-04 MED ORDER — HYDROMORPHONE HCL 1 MG/ML IJ SOLN: 0.2500 mg | INTRAMUSCULAR | Status: DC | PRN

## 2017-06-04 MED ORDER — HYDROCODONE-ACETAMINOPHEN 7.5-325 MG PO TABS: 1.0000 | ORAL_TABLET | Freq: Once | ORAL | Status: DC | PRN

## 2017-06-04 MED ORDER — KETOROLAC TROMETHAMINE 30 MG/ML IJ SOLN
INTRAMUSCULAR | Status: DC | PRN
  Administered 2017-06-04: 30 mg via INTRAVENOUS

## 2017-06-04 MED ORDER — DEXAMETHASONE SODIUM PHOSPHATE 4 MG/ML IJ SOLN
INTRAMUSCULAR | Status: DC | PRN
  Administered 2017-06-04: 8 mg via INTRAVENOUS

## 2017-06-04 MED ORDER — PROMETHAZINE HCL 25 MG/ML IJ SOLN: 6.2500 mg | Freq: Four times a day (QID) | INTRAMUSCULAR | Status: DC | PRN

## 2017-06-04 MED ORDER — CHLORHEXIDINE GLUCONATE CLOTH 2 % EX PADS: 6.0000 | MEDICATED_PAD | Freq: Once | CUTANEOUS | Status: DC

## 2017-06-04 MED ORDER — ONDANSETRON HCL 4 MG/2ML IJ SOLN
4.0000 mg | Freq: Once | INTRAMUSCULAR | Status: AC | PRN
  Administered 2017-06-04: 4 mg via INTRAVENOUS
  Filled 2017-06-04: qty 2

## 2017-06-04 MED ORDER — HEMOSTATIC AGENTS (NO CHARGE) OPTIME
TOPICAL | Status: DC | PRN
  Administered 2017-06-04: 1 via TOPICAL

## 2017-06-04 MED ORDER — SUCCINYLCHOLINE CHLORIDE 20 MG/ML IJ SOLN
INTRAMUSCULAR | Status: DC | PRN
  Administered 2017-06-04: 140 mg via INTRAVENOUS

## 2017-06-04 MED ORDER — DEXAMETHASONE SODIUM PHOSPHATE 4 MG/ML IJ SOLN
INTRAMUSCULAR | Status: AC
  Filled 2017-06-04: qty 2

## 2017-06-04 MED ORDER — ONDANSETRON HCL 4 MG/2ML IJ SOLN
INTRAMUSCULAR | Status: AC
  Filled 2017-06-04: qty 2

## 2017-06-04 MED ORDER — KETOROLAC TROMETHAMINE 30 MG/ML IJ SOLN: 30.0000 mg | Freq: Once | INTRAMUSCULAR | Status: DC | PRN

## 2017-06-04 MED ORDER — LACTATED RINGERS IV SOLN
INTRAVENOUS | Status: DC
  Administered 2017-06-04 (×2): via INTRAVENOUS

## 2017-06-04 MED ORDER — FENTANYL CITRATE (PF) 100 MCG/2ML IJ SOLN
INTRAMUSCULAR | Status: AC
  Filled 2017-06-04: qty 4

## 2017-06-04 MED ORDER — POVIDONE-IODINE 10 % EX OINT
TOPICAL_OINTMENT | CUTANEOUS | Status: AC
  Filled 2017-06-04: qty 1

## 2017-06-04 MED ORDER — POVIDONE-IODINE 10 % OINT PACKET
TOPICAL_OINTMENT | CUTANEOUS | Status: DC | PRN
  Administered 2017-06-04: 1 via TOPICAL

## 2017-06-04 MED ORDER — HYDROCODONE-ACETAMINOPHEN 5-325 MG PO TABS: 1.0000 | ORAL_TABLET | ORAL | 0 refills | Status: DC | PRN

## 2017-06-04 MED ORDER — BUPIVACAINE HCL (PF) 0.5 % IJ SOLN
INTRAMUSCULAR | Status: AC
  Filled 2017-06-04: qty 30

## 2017-06-04 MED ORDER — SUGAMMADEX SODIUM 200 MG/2ML IV SOLN
INTRAVENOUS | Status: DC | PRN
  Administered 2017-06-04: 150 mg via INTRAVENOUS

## 2017-06-04 MED ORDER — PROPOFOL 10 MG/ML IV BOLUS
INTRAVENOUS | Status: DC | PRN
  Administered 2017-06-04: 170 mg via INTRAVENOUS

## 2017-06-04 SURGICAL SUPPLY — 43 items
APPLIER CLIP ROT 10 11.4 M/L (STAPLE) ×2
APR CLP MED LRG 11.4X10 (STAPLE) ×1
BAG HAMPER (MISCELLANEOUS) ×2 IMPLANT
BAG RETRIEVAL 10 (BASKET) ×1
CHLORAPREP W/TINT 26ML (MISCELLANEOUS) ×2 IMPLANT
CLIP APPLIE ROT 10 11.4 M/L (STAPLE) ×1 IMPLANT
CLOTH BEACON ORANGE TIMEOUT ST (SAFETY) ×2 IMPLANT
COVER LIGHT HANDLE STERIS (MISCELLANEOUS) ×4 IMPLANT
DECANTER SPIKE VIAL GLASS SM (MISCELLANEOUS) ×2 IMPLANT
ELECT REM PT RETURN 9FT ADLT (ELECTROSURGICAL) ×2
ELECTRODE REM PT RTRN 9FT ADLT (ELECTROSURGICAL) ×1 IMPLANT
FILTER SMOKE EVAC LAPAROSHD (FILTER) ×2 IMPLANT
GLOVE BIOGEL PI IND STRL 7.0 (GLOVE) ×1 IMPLANT
GLOVE BIOGEL PI INDICATOR 7.0 (GLOVE) ×1
GLOVE SURG SS PI 7.5 STRL IVOR (GLOVE) ×2 IMPLANT
GOWN STRL REUS W/ TWL XL LVL3 (GOWN DISPOSABLE) ×1 IMPLANT
GOWN STRL REUS W/TWL LRG LVL3 (GOWN DISPOSABLE) ×4 IMPLANT
GOWN STRL REUS W/TWL XL LVL3 (GOWN DISPOSABLE) ×2
HEMOSTAT SNOW SURGICEL 2X4 (HEMOSTASIS) ×2 IMPLANT
INST SET LAPROSCOPIC AP (KITS) ×2 IMPLANT
KIT TURNOVER KIT A (KITS) ×2 IMPLANT
MANIFOLD NEPTUNE II (INSTRUMENTS) ×2 IMPLANT
NDL HYPO 25X1 1.5 SAFETY (NEEDLE) ×1 IMPLANT
NDL INSUFFLATION 14GA 120MM (NEEDLE) ×1 IMPLANT
NEEDLE HYPO 25X1 1.5 SAFETY (NEEDLE) ×2 IMPLANT
NEEDLE INSUFFLATION 14GA 120MM (NEEDLE) ×2 IMPLANT
NS IRRIG 1000ML POUR BTL (IV SOLUTION) ×2 IMPLANT
PACK LAP CHOLE LZT030E (CUSTOM PROCEDURE TRAY) ×2 IMPLANT
PAD ARMBOARD 7.5X6 YLW CONV (MISCELLANEOUS) ×2 IMPLANT
SET BASIN LINEN APH (SET/KITS/TRAYS/PACK) ×2 IMPLANT
SLEEVE ENDOPATH XCEL 5M (ENDOMECHANICALS) ×2 IMPLANT
SPONGE GAUZE 2X2 8PLY STRL LF (GAUZE/BANDAGES/DRESSINGS) ×5 IMPLANT
STAPLER VISISTAT (STAPLE) ×2 IMPLANT
SUT VICRYL 0 UR6 27IN ABS (SUTURE) ×2 IMPLANT
SYS BAG RETRIEVAL 10MM (BASKET) ×1
SYSTEM BAG RETRIEVAL 10MM (BASKET) ×1 IMPLANT
TAPE PAPER 2X10 WHT MICROPORE (GAUZE/BANDAGES/DRESSINGS) ×1 IMPLANT
TROCAR ENDO BLADELESS 11MM (ENDOMECHANICALS) ×2 IMPLANT
TROCAR XCEL NON-BLD 5MMX100MML (ENDOMECHANICALS) ×2 IMPLANT
TROCAR XCEL UNIV SLVE 11M 100M (ENDOMECHANICALS) ×2 IMPLANT
TUBE CONNECTING 12X1/4 (SUCTIONS) ×2 IMPLANT
TUBING INSUFFLATION (TUBING) ×2 IMPLANT
WARMER LAPAROSCOPE (MISCELLANEOUS) ×2 IMPLANT

## 2017-06-04 NOTE — Transfer of Care (Signed)
Immediate Anesthesia Transfer of Care Note  Patient: Angela Smith  Procedure(s) Performed: LAPAROSCOPIC CHOLECYSTECTOMY (N/A Abdomen)  Patient Location: PACU  Anesthesia Type:General  Level of Consciousness: awake and patient cooperative  Airway & Oxygen Therapy: Patient Spontanous Breathing and Patient connected to nasal cannula oxygen  Post-op Assessment: Report given to RN, Post -op Vital signs reviewed and stable and Patient moving all extremities  Post vital signs: Reviewed and stable  Last Vitals:  Vitals Value Taken Time  BP 103/72 06/04/2017 10:57 AM  Temp    Pulse 65 06/04/2017 10:58 AM  Resp 14 06/04/2017 10:58 AM  SpO2 100 % 06/04/2017 10:58 AM  Vitals shown include unvalidated device data.  Last Pain:  Vitals:   06/04/17 0939  TempSrc: Oral  PainSc: 0-No pain      Patients Stated Pain Goal: 5 (87/[id number]/11 [id number])  Complications: No apparent anesthesia complications

## 2017-06-04 NOTE — Interval H&P Note (Signed)
History and Physical Interval Note:  06/04/2017 9:35 AM  Angela Smith  has presented today for surgery, with the diagnosis of chronic cholecystitis  The various methods of treatment have been discussed with the patient and family. After consideration of risks, benefits and other options for treatment, the patient has consented to  Procedure(s): LAPAROSCOPIC CHOLECYSTECTOMY (N/A) as a surgical intervention .  The patient's history has been reviewed, patient examined, no change in status, stable for surgery.  I have reviewed the patient's chart and labs.  Questions were answered to the patient's satisfaction.     Aviva Signs

## 2017-06-04 NOTE — Op Note (Signed)
Patient:  Angela Smith  DOB:  04-Oct-1975  MRN:  [id number]   Preop Diagnosis: Chronic cholecystitis  Postop Diagnosis: Same  Procedure: Laparoscopic cholecystectomy  Surgeon: Aviva Signs, MD  Anes: General tracheal  Indications: Patient is a 42 year old white female who presents with biliary colic secondary to chronic cholecystitis.  The risks and benefits of the procedure include bleeding, infection, hepatobiliary injury, and the possibility of an open procedure were fully explained to the patient, who gave informed consent.  In addition, the patient requested that I lanced a sebaceous cyst right below the subxiphoid process on the abdominal wall.  Procedure note: The patient was placed in supine position.  After induction of general endotracheal anesthesia, the abdomen was prepped and draped using the usual sterile technique with DuraPrep.  Surgical site confirmation was performed.  A supraumbilical incision was made down to the fascia.  A Veress needle was introduced into the abdominal cavity and confirmation of placement was done using saline drop test.  The abdomen was then insufflated to 16 mmHg pressure.  An 11 mm trocar was introduced into the abdominal cavity under direct visualization without difficulty.  The patient was placed in reverse Trendelenburg position and an additional 11 mm trocar was placed in the epigastric region and 5 mm trochars were placed the right upper quadrant and right flank regions.  The liver was inspected and several filmy adhesions were noted to the abdominal wall.  I suspect this was secondary to her history of perforated appendicitis.  These were lysed sharply using scissors.  The gallbladder was then retracted in a dynamic fashion in order to provide a critical view of the triangle of Calot.  The cystic duct was first identified.  Its juncture to the infundibulum was fully identified.  Endoclips were placed proximally and distally on cystic duct, and  the cystic duct was divided.  This was likewise done the cystic artery.  The gallbladder was freed away from the gallbladder fossa using Bovie electrocautery.  The gallbladder was delivered through the epigastric trocar site using an Endo Catch bag.  Gallbladder fossa was inspected no abnormal bleeding or bile leakage was noted.  Surgicel was placed in the gallbladder fossa.  All fluid and air were then evacuated from the abdominal cavity prior to removal of the trochars.  All wounds were irrigated with normal saline.  All wounds were injected with 25% Sensorcaine.  The supraumbilical fascia was reapproximated using an 0 Vicryl interrupted suture.  All skin incisions were closed using staples.  Betadine ointment and dry sterile dressings were applied.  The cystic area in the subxiphoid process was lanced.  Necrotic adipose tissue was found.  This was removed without difficulty.  A single skin clip was placed to close the incision.  Betadine ointment and dry sterile dressing were applied.  All tape and needle counts were correct at the end of the procedure.  The patient was extubated in the operating room and transferred to PACU in stable condition.  Complications: None  EBL: Minimal  Specimen: Gallbladder

## 2017-06-04 NOTE — Anesthesia Procedure Notes (Signed)
Procedure Name: Intubation Date/Time: 06/04/2017 10:10 AM Performed by: Charmaine Downs, CRNA Pre-anesthesia Checklist: Patient identified, Patient being monitored, Timeout performed, Emergency Drugs available and Suction available Patient Re-evaluated:Patient Re-evaluated prior to induction Oxygen Delivery Method: Circle System Utilized Preoxygenation: Pre-oxygenation with 100% oxygen Induction Type: IV induction Ventilation: Mask ventilation without difficulty Laryngoscope Size: Mac and 4 Grade View: Grade I Tube type: Oral Tube size: 7.0 mm Number of attempts: 1 Airway Equipment and Method: stylet Placement Confirmation: ETT inserted through vocal cords under direct vision,  positive ETCO2 and breath sounds checked- equal and bilateral Secured at: 22 cm Tube secured with: Tape Dental Injury: Teeth and Oropharynx as per pre-operative assessment

## 2017-06-04 NOTE — Anesthesia Preprocedure Evaluation (Signed)
Anesthesia Evaluation  Patient identified by MRN, date of birth, ID band Patient awake    Reviewed: Allergy & Precautions, H&P , NPO status , Patient's Chart, lab work & pertinent test results, reviewed documented beta blocker date and time   Airway Mallampati: I  TM Distance: >3 FB Neck ROM: full   Comment: +TMJ Dental no notable dental hx.    Pulmonary neg pulmonary ROS,    Pulmonary exam normal breath sounds clear to auscultation       Cardiovascular Exercise Tolerance: Good negative cardio ROS   Rhythm:regular Rate:Normal     Neuro/Psych negative neurological ROS  negative psych ROS   GI/Hepatic negative GI ROS, Neg liver ROS,   Endo/Other  negative endocrine ROS  Renal/GU negative Renal ROS  negative genitourinary   Musculoskeletal   Abdominal   Peds  Hematology negative hematology ROS (+)   Anesthesia Other Findings   Reproductive/Obstetrics negative OB ROS                             Anesthesia Physical Anesthesia Plan  ASA: I  Anesthesia Plan: General   Post-op Pain Management:    Induction:   PONV Risk Score and Plan: Ondansetron and Dexamethasone  Airway Management Planned:   Additional Equipment:   Intra-op Plan:   Post-operative Plan:   Informed Consent: I have reviewed the patients History and Physical, chart, labs and discussed the procedure including the risks, benefits and alternatives for the proposed anesthesia with the patient or authorized representative who has indicated his/her understanding and acceptance.   Dental Advisory Given  Plan Discussed with: CRNA  Anesthesia Plan Comments:         Anesthesia Quick Evaluation

## 2017-06-04 NOTE — Discharge Instructions (Signed)
Laparoscopic Cholecystectomy, Care After °This sheet gives you information about how to care for yourself after your procedure. Your health care provider may also give you more specific instructions. If you have problems or questions, contact your health care provider. °What can I expect after the procedure? °After the procedure, it is common to have: °· Pain at your incision sites. You will be given medicines to control this pain. °· Mild nausea or vomiting. °· Bloating and possible shoulder pain from the air-like gas that was used during the procedure. ° °Follow these instructions at home: °Incision care ° °· Follow instructions from your health care provider about how to take care of your incisions. Make sure you: °? Wash your hands with soap and water before you change your bandage (dressing). If soap and water are not available, use hand sanitizer. °? Change your dressing as told by your health care provider. °? Leave stitches (sutures), skin glue, or adhesive strips in place. These skin closures may need to be in place for 2 weeks or longer. If adhesive strip edges start to loosen and curl up, you may trim the loose edges. Do not remove adhesive strips completely unless your health care provider tells you to do that. °· Do not take baths, swim, or use a hot tub until your health care provider approves. Ask your health care provider if you can take showers. You may only be allowed to take sponge baths for bathing. °· Check your incision area every day for signs of infection. Check for: °? More redness, swelling, or pain. °? More fluid or blood. °? Warmth. °? Pus or a bad smell. °Activity °· Do not drive or use heavy machinery while taking prescription pain medicine. °· Do not lift anything that is heavier than 10 lb (4.5 kg) until your health care provider approves. °· Do not play contact sports until your health care provider approves. °· Do not drive for 24 hours if you were given a medicine to help you relax  (sedative). °· Rest as needed. Do not return to work or school until your health care provider approves. °General instructions °· Take over-the-counter and prescription medicines only as told by your health care provider. °· To prevent or treat constipation while you are taking prescription pain medicine, your health care provider may recommend that you: °? Drink enough fluid to keep your urine clear or pale yellow. °? Take over-the-counter or prescription medicines. °? Eat foods that are high in fiber, such as fresh fruits and vegetables, whole grains, and beans. °? Limit foods that are high in fat and processed sugars, such as fried and sweet foods. °Contact a health care provider if: °· You develop a rash. °· You have more redness, swelling, or pain around your incisions. °· You have more fluid or blood coming from your incisions. °· Your incisions feel warm to the touch. °· You have pus or a bad smell coming from your incisions. °· You have a fever. °· One or more of your incisions breaks open. °Get help right away if: °· You have trouble breathing. °· You have chest pain. °· You have increasing pain in your shoulders. °· You faint or feel dizzy when you stand. °· You have severe pain in your abdomen. °· You have nausea or vomiting that lasts for more than one day. °· You have leg pain. °This information is not intended to replace advice given to you by your health care provider. Make sure you discuss any questions you   have with your health care provider. °Document Released: 01/26/2005 Document Revised: 08/17/2015 Document Reviewed: 07/15/2015 °Elsevier Interactive Patient Education © 2018 [employer] ° °

## 2017-06-04 NOTE — Anesthesia Postprocedure Evaluation (Signed)
Anesthesia Post Note  Patient: Angela Smith  Procedure(s) Performed: LAPAROSCOPIC CHOLECYSTECTOMY (N/A Abdomen)  Patient location during evaluation: PACU Anesthesia Type: General Level of consciousness: awake and alert and patient cooperative Pain management: satisfactory to patient Vital Signs Assessment: post-procedure vital signs reviewed and stable Respiratory status: spontaneous breathing Cardiovascular status: stable Postop Assessment: no apparent nausea or vomiting Anesthetic complications: no     Last Vitals:  Vitals:   06/04/17 1115 06/04/17 1130  BP: 110/71 107/83  Pulse: (!) 53   Resp: 12 11  Temp:    SpO2: 100% 100%    Last Pain:  Vitals:   06/04/17 1130  TempSrc:   PainSc: 1                  Soren Lazarz

## 2017-06-04 NOTE — Progress Notes (Signed)
Patient has asked to have a sebaceous cyst on abdominal wall lanced during surgery.

## 2017-06-07 ENCOUNTER — Encounter (HOSPITAL_COMMUNITY): Payer: Self-pay | Admitting: General Surgery

## 2017-06-10 ENCOUNTER — Encounter: Payer: Self-pay | Admitting: General Surgery

## 2017-06-10 ENCOUNTER — Ambulatory Visit (INDEPENDENT_AMBULATORY_CARE_PROVIDER_SITE_OTHER): Payer: Self-pay | Admitting: General Surgery

## 2017-06-10 VITALS — BP 112/65 | HR 76 | Temp 97.6°F | Ht 69.0 in | Wt 168.0 lb

## 2017-06-10 DIAGNOSIS — Z09 Encounter for follow-up examination after completed treatment for conditions other than malignant neoplasm: Secondary | ICD-10-CM

## 2017-06-10 NOTE — Progress Notes (Signed)
Subjective:     Angela Smith  Status post laparoscopic cholecystectomy.  Doing well.  Her preoperative symptoms have resolved.  She is pleased with the results. Objective:    BP 112/65   Pulse 76   Temp 97.6 F (36.4 C)   Ht 5\' 9"  (1.753 m)   Wt 168 lb (76.2 kg)   BMI 24.81 kg/m   General:  alert, cooperative and no distress  Abdomen soft, incisions healing well.  Staples removed, Steri-Strips applied. Final pathology reveals chronic cholecystitis     Assessment:    Doing well postoperatively.    Plan:   May increase activity as able.  Follow-up here as needed.

## 2017-06-13 ENCOUNTER — Other Ambulatory Visit: Payer: Self-pay | Admitting: Nurse Practitioner

## 2017-07-02 ENCOUNTER — Encounter: Payer: Self-pay | Admitting: Gastroenterology

## 2017-10-21 ENCOUNTER — Encounter: Payer: Self-pay | Admitting: Neurology

## 2017-10-21 ENCOUNTER — Telehealth: Payer: Self-pay | Admitting: Neurology

## 2017-10-21 ENCOUNTER — Ambulatory Visit (INDEPENDENT_AMBULATORY_CARE_PROVIDER_SITE_OTHER): Payer: BLUE CROSS/BLUE SHIELD | Admitting: Neurology

## 2017-10-21 VITALS — BP 117/74 | HR 74 | Ht 69.0 in | Wt 174.0 lb

## 2017-10-21 DIAGNOSIS — R4789 Other speech disturbances: Secondary | ICD-10-CM | POA: Insufficient documentation

## 2017-10-21 DIAGNOSIS — R479 Unspecified speech disturbances: Secondary | ICD-10-CM | POA: Diagnosis not present

## 2017-10-21 DIAGNOSIS — R413 Other amnesia: Secondary | ICD-10-CM

## 2017-10-21 DIAGNOSIS — H53489 Generalized contraction of visual field, unspecified eye: Secondary | ICD-10-CM | POA: Insufficient documentation

## 2017-10-21 NOTE — Telephone Encounter (Signed)
lvm for pt to call back about scheduling mri  [employer] Auth: [id number] (exp. 10/21/17 to 11/19/17)

## 2017-10-21 NOTE — Telephone Encounter (Signed)
Patient returned my call she is scheduled for 10/26/17 at [facility].

## 2017-10-21 NOTE — Progress Notes (Signed)
PATIENT: SAMINA WEEKES DOB: [birth year]  Chief Complaint  Patient presents with  . Memory Loss    MMSE 30/30 - 14 animals.  She has been experiencing word finding difficulty, memory loss, inability to control food cravings and visual hallucinations since having her appendix out in 2018.  [facility]    Everlene Farrier, MD - referring MD  . PCP    Unk Pinto, MD     HISTORICAL  Lamont Dowdy, seen in request by her GYN Dr. Everlene Farrier and Dr.  Unk Pinto for evaluation of memory loss.  Initial evaluation was on October 21, 2017.   I have reviewed and summarized the referring note from the referring physician, she had a history of a history of appendectomy in July 2018, postsurgically, she was given Dilaudid, she describes hallucinations, seeing the tissue paper flowing around in the space, the walled looked like ripped waterfall.   The significant visual hallucination last about couple days, but since then, she had word finding difficulties, has trouble think about the words, take her few seconds, also described visual field change, she sees black spots in bilateral peripheral visual field, now the black spots become bigger, sometimes feels like a black cat running through her visual field, she also complains of memory loss, has to write down notes all the time.   REVIEW OF SYSTEMS: Full 14 system review of systems performed and notable only for knee, hallucinations, memory loss, confusion, weight gain All other review of systems were negative.  ALLERGIES: Allergies  Allergen Reactions  . Dilaudid [Hydromorphone] Other (See Comments)    Hallucinations   . Rhinocort [Budesonide] Palpitations and Other (See Comments)    Heart palpitations    HOME MEDICATIONS: Current Outpatient Medications  Medication Sig Dispense Refill  . Digestive Enzymes (DIGESTIVE ENZYME PO) Take 1 tablet by mouth daily.    [facility] Kitchen ibuprofen (ADVIL,MOTRIN) 200 MG tablet Take 400 mg by mouth  every 6 (six) hours as needed for headache, moderate pain or cramping.    . Levonorgestrel (MIRENA IU) 1 each by Intrauterine route once.     . Probiotic Product (PROBIOTIC DAILY PO) Take 1 capsule by mouth daily.     No current facility-administered medications for this visit.     PAST MEDICAL HISTORY: Past Medical History:  Diagnosis Date  . Allergy    ALLERGIC RHINITS  . Fibrocystic breast disease   . Hyperlipidemia    DIET/ HERBAL CONTROLED  . Memory difficulty   . Miscarriage 2006   TWO SEPERATE 4 MONTHS APART  . 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 On no meds and no more seizures.    PAST SURGICAL HISTORY: Past Surgical History:  Procedure Laterality Date  . APPENDECTOMY  2018  . CHOLECYSTECTOMY N/A 06/04/2017   Procedure: LAPAROSCOPIC CHOLECYSTECTOMY;  Surgeon: Aviva Signs, MD;  Location: [facility];  Service: General;  Laterality: N/A;  . DILATION AND CURETTAGE OF UTERUS  2006   MISCARRIAGE  . ESOPHAGOGASTRODUODENOSCOPY N/A 01/11/2017   mild gastritis, normal small bowel  . KNEE ARTHROSCOPY Left    X 2  . LAPAROSCOPIC APPENDECTOMY N/A 08/10/2016   Procedure: APPENDECTOMY LAPAROSCOPIC;  Surgeon: Aviva Signs, MD;  Location: [facility];  Service: General;  Laterality: N/A;    FAMILY HISTORY: Family History  Problem Relation Age of Onset  . Depression Mother   . Hypertension Father   . Diabetes Maternal Grandmother   . Osteoporosis Maternal Grandmother   . Heart disease Maternal Grandfather   .  Hyperlipidemia Maternal Grandfather   . Cancer Maternal Grandfather        PANCREATIC/ BONE  . Cancer Paternal Grandfather        THROAT  . Colon cancer Neg Hx   . Colon polyps Neg Hx     SOCIAL HISTORY: Social History   Socioeconomic History  . Marital status: Married    Spouse name: Not on file  . Number of children: 2  . Years of education: 27  . Highest education level: High school graduate  Occupational History  . 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 Financial resource strain: Not on file  . Food insecurity:    Worry: Not on file    Inability: Not on file  . Transportation needs:    Medical: Not on file    Non-medical: Not on file  Tobacco Use  . Smoking status: Former Research scientist (life sciences)  . Smokeless tobacco: Never Used  Substance and Sexual Activity  . Alcohol use: Not Currently  . Drug use: No  . Sexual activity: Yes    Birth control/protection: IUD  Lifestyle  . Physical activity:    Days per week: Not on file    Minutes per session: Not on file  . Stress: Not on file  Relationships  . Social connections:    Talks on phone: Not on file    Gets together: Not on file    Attends religious service: Not on file    Active member of club or organization: Not on file    Attends meetings of clubs or organizations: Not on file    Relationship status: Not on file  . Intimate partner violence:    Fear of current or ex partner: Not on file    Emotionally abused: Not on file    Physically abused: Not on file    Forced sexual activity: Not on file  Other Topics Concern  . Not on file  Social History Narrative   Lives at home with husband and children.   Right-handed.   2 sodas per day.     PHYSICAL EXAM   Vitals:   10/21/17 0917  BP: 117/74  Pulse: 74  Weight: 174 lb (78.9 kg)  Height: 5\' 9"  (1.753 m)    Not recorded      Body mass index is 25.7 kg/m.  PHYSICAL EXAMNIATION:  Gen: NAD, conversant, well nourised, obese, well groomed                     Cardiovascular: Regular rate rhythm, no peripheral edema, warm, nontender. Eyes: Conjunctivae clear without exudates or hemorrhage Neck: Supple, no carotid bruits. Pulmonary: Clear to auscultation bilaterally   NEUROLOGICAL EXAM:  MMSE - Mini Mental State Exam 10/21/2017  Orientation to time 5  Orientation to Place 5  Registration 3  Attention/ Calculation 5  Recall 3  Language- name 2 objects 2  Language- repeat  1  Language- follow 3 step command 3  Language- read & follow direction 1  Write a sentence 1  Copy design 1  Total score 30  animal naming 14.   CRANIAL NERVES: CN II: Visual fields are full to confrontation. Fundoscopic exam is normal with sharp discs and no vascular changes. Pupils are round equal and briskly reactive to light. CN III, IV, VI: extraocular movement are normal. No ptosis. CN V: Facial sensation is intact to pinprick in all 3 divisions bilaterally. Corneal responses  are intact.  CN VII: Face is symmetric with normal eye closure and smile. CN VIII: Hearing is normal to rubbing fingers CN IX, X: Palate elevates symmetrically. Phonation is normal. CN XI: Head turning and shoulder shrug are intact CN XII: Tongue is midline with normal movements and no atrophy.  MOTOR: There is no pronator drift of out-stretched arms. Muscle bulk and tone are normal. Muscle strength is normal.  REFLEXES: Reflexes are 2+ and symmetric at the biceps, triceps, knees, and ankles. Plantar responses are flexor.  SENSORY: Intact to light touch, pinprick, positional sensation and vibratory sensation are intact in fingers and toes.  COORDINATION: Rapid alternating movements and fine finger movements are intact. There is no dysmetria on finger-to-nose and heel-knee-shin.    GAIT/STANCE: Mildly antalgic due to recent onset of low back pain   DIAGNOSTIC DATA (LABS, IMAGING, TESTING) - I reviewed patient records, labs, notes, testing and imaging myself where available.   ASSESSMENT AND PLAN  WARDA MCQUEARY is a 42 y.o. female   Word finding difficulties, Memory loss Visual field disturbance  MRI of the brain to rule out structural lesion  Laboratory evaluations including TSH, B12  Marcial Pacas, M.D. Ph.D.  [facility] [address], [place], [place] [address] Ph: [phone] Fax: [phone]  CC:  Unk Pinto, MD Everlene Farrier, MD

## 2017-10-22 LAB — CBC WITH DIFFERENTIAL
BASOS: 1 %
Basophils Absolute: 0 10*3/uL (ref 0.0–0.2)
EOS (ABSOLUTE): 0 10*3/uL (ref 0.0–0.4)
EOS: 1 %
HEMATOCRIT: 41.1 % (ref 34.0–46.6)
Hemoglobin: 13.2 g/dL (ref 11.1–15.9)
Immature Grans (Abs): 0 10*3/uL (ref 0.0–0.1)
Immature Granulocytes: 0 %
LYMPHS ABS: 1.5 10*3/uL (ref 0.7–3.1)
Lymphs: 26 %
MCH: 30.3 pg (ref 26.6–33.0)
MCHC: 32.1 g/dL (ref 31.5–35.7)
MCV: 94 fL (ref 79–97)
MONOS ABS: 0.5 10*3/uL (ref 0.1–0.9)
Monocytes: 8 %
NEUTROS ABS: 3.8 10*3/uL (ref 1.4–7.0)
Neutrophils: 64 %
RBC: 4.36 x10E6/uL (ref 3.77–5.28)
RDW: 13.6 % (ref 12.3–15.4)
WBC: 5.8 10*3/uL (ref 3.4–10.8)

## 2017-10-22 LAB — COMPREHENSIVE METABOLIC PANEL
ALBUMIN: 4.5 g/dL (ref 3.5–5.5)
ALT: 6 IU/L (ref 0–32)
AST: 19 IU/L (ref 0–40)
Albumin/Globulin Ratio: 1.9 (ref 1.2–2.2)
Alkaline Phosphatase: 79 IU/L (ref 39–117)
BUN/Creatinine Ratio: 14 (ref 9–23)
BUN: 11 mg/dL (ref 6–24)
Bilirubin Total: 0.4 mg/dL (ref 0.0–1.2)
CO2: 25 mmol/L (ref 20–29)
CREATININE: 0.78 mg/dL (ref 0.57–1.00)
Calcium: 9.7 mg/dL (ref 8.7–10.2)
Chloride: 105 mmol/L (ref 96–106)
GFR, EST AFRICAN AMERICAN: 108 mL/min/{1.73_m2} (ref >59)
GFR, EST NON AFRICAN AMERICAN: 94 mL/min/{1.73_m2} (ref >59)
GLOBULIN, TOTAL: 2.4 g/dL (ref 1.5–4.5)
GLUCOSE: 88 mg/dL (ref 65–99)
Potassium: 4.7 mmol/L (ref 3.5–5.2)
SODIUM: 143 mmol/L (ref 134–144)
TOTAL PROTEIN: 6.9 g/dL (ref 6.0–8.5)

## 2017-10-22 LAB — TSH: TSH: 2.74 u[IU]/mL (ref 0.450–4.500)

## 2017-10-22 LAB — VITAMIN B12: Vitamin B-12: 1293 pg/mL — ABNORMAL HIGH (ref 232–1245)

## 2017-10-26 ENCOUNTER — Ambulatory Visit (INDEPENDENT_AMBULATORY_CARE_PROVIDER_SITE_OTHER): Payer: BLUE CROSS/BLUE SHIELD

## 2017-10-26 DIAGNOSIS — R479 Unspecified speech disturbances: Secondary | ICD-10-CM | POA: Diagnosis not present

## 2017-12-27 ENCOUNTER — Telehealth: Payer: Self-pay | Admitting: Family Medicine

## 2017-12-27 NOTE — Telephone Encounter (Signed)
Pt could like to know if you would take her as a new pt?

## 2017-12-28 NOTE — Telephone Encounter (Signed)
Ok to establish 

## 2017-12-28 NOTE — Telephone Encounter (Signed)
Pt is aware and has scheduled an appt

## 2018-01-24 ENCOUNTER — Other Ambulatory Visit: Payer: Self-pay

## 2018-01-24 ENCOUNTER — Ambulatory Visit (INDEPENDENT_AMBULATORY_CARE_PROVIDER_SITE_OTHER): Payer: BLUE CROSS/BLUE SHIELD | Admitting: Family Medicine

## 2018-01-24 ENCOUNTER — Encounter: Payer: Self-pay | Admitting: Family Medicine

## 2018-01-24 VITALS — BP 118/78 | HR 78 | Temp 98.1°F | Resp 16 | Ht 69.0 in | Wt 177.2 lb

## 2018-01-24 DIAGNOSIS — E663 Overweight: Secondary | ICD-10-CM

## 2018-01-24 DIAGNOSIS — Z0000 Encounter for general adult medical examination without abnormal findings: Secondary | ICD-10-CM | POA: Diagnosis not present

## 2018-01-24 LAB — LIPID PANEL
CHOL/HDL RATIO: 3
CHOLESTEROL: 153 mg/dL (ref 0–200)
HDL: 55.5 mg/dL (ref >39.00)
LDL Cholesterol: 84 mg/dL (ref 0–99)
NonHDL: 97.01
TRIGLYCERIDES: 63 mg/dL (ref 0.0–149.0)
VLDL: 12.6 mg/dL (ref 0.0–40.0)

## 2018-01-24 LAB — CBC WITH DIFFERENTIAL/PLATELET
Basophils Absolute: 0 10*3/uL (ref 0.0–0.1)
Basophils Relative: 0.3 % (ref 0.0–3.0)
EOS PCT: 0.5 % (ref 0.0–5.0)
Eosinophils Absolute: 0 10*3/uL (ref 0.0–0.7)
HCT: 39.1 % (ref 36.0–46.0)
Hemoglobin: 13.3 g/dL (ref 12.0–15.0)
LYMPHS ABS: 2.2 10*3/uL (ref 0.7–4.0)
Lymphocytes Relative: 34 % (ref 12.0–46.0)
MCHC: 34.1 g/dL (ref 30.0–36.0)
MCV: 92.3 fl (ref 78.0–100.0)
Monocytes Absolute: 0.5 10*3/uL (ref 0.1–1.0)
Monocytes Relative: 7.6 % (ref 3.0–12.0)
Neutro Abs: 3.7 10*3/uL (ref 1.4–7.7)
Neutrophils Relative %: 57.6 % (ref 43.0–77.0)
PLATELETS: 409 10*3/uL — AB (ref 150.0–400.0)
RBC: 4.24 Mil/uL (ref 3.87–5.11)
RDW: 13.2 % (ref 11.5–15.5)
WBC: 6.4 10*3/uL (ref 4.0–10.5)

## 2018-01-24 LAB — HEPATIC FUNCTION PANEL
ALBUMIN: 4.6 g/dL (ref 3.5–5.2)
ALT: 20 U/L (ref 0–35)
AST: 18 U/L (ref 0–37)
Alkaline Phosphatase: 63 U/L (ref 39–117)
Bilirubin, Direct: 0.1 mg/dL (ref 0.0–0.3)
Total Bilirubin: 0.6 mg/dL (ref 0.2–1.2)
Total Protein: 7.6 g/dL (ref 6.0–8.3)

## 2018-01-24 LAB — TSH: TSH: 2.82 u[IU]/mL (ref 0.35–4.50)

## 2018-01-24 LAB — BASIC METABOLIC PANEL
BUN: 15 mg/dL (ref 6–23)
CHLORIDE: 101 meq/L (ref 96–112)
CO2: 28 mEq/L (ref 19–32)
Calcium: 9.7 mg/dL (ref 8.4–10.5)
Creatinine, Ser: 0.82 mg/dL (ref 0.40–1.20)
GFR: 80.85 mL/min (ref >60.00)
GLUCOSE: 88 mg/dL (ref 70–99)
Potassium: 3.6 mEq/L (ref 3.5–5.1)
Sodium: 137 mEq/L (ref 135–145)

## 2018-01-24 MED ORDER — PANTOPRAZOLE SODIUM 40 MG PO TBEC: 40.0000 mg | DELAYED_RELEASE_TABLET | Freq: Every day | ORAL | 3 refills | Status: DC

## 2018-01-24 NOTE — Assessment & Plan Note (Signed)
PE WNL.  UTD on GYN, immunizations.  Check labs.  Anticipatory guidance provided.

## 2018-01-24 NOTE — Progress Notes (Signed)
   Subjective:    Patient ID: Angela Smith, female    DOB: Apr 29, 1975, 42 y.o.   MRN: [id number]  HPI New to establish.  Previous MD- Melford Aase   UTD on GYN ([place])  CPE- UTD on pap, mammo, Tdap, flu.   Review of Systems Patient reports no vision/ hearing changes, adenopathy,fever, weight change,  persistant/recurrent hoarseness , swallowing issues, chest pain, palpitations, edema, persistant/recurrent cough, hemoptysis, dyspnea (rest/exertional/paroxysmal nocturnal), gastrointestinal bleeding (melena, rectal bleeding), abdominal pain, bowel changes, GU symptoms (dysuria, hematuria, incontinence), Gyn symptoms (abnormal  bleeding, pain),  syncope, focal weakness, memory loss, numbness & tingling, skin/hair/nail changes, abnormal bruising or bleeding, anxiety, or depression.   + GERD- has been on Protonix previously w/ good results    Objective:   Physical Exam General Appearance:    Alert, cooperative, no distress, appears stated age  Head:    Normocephalic, without obvious abnormality, atraumatic  Eyes:    PERRL, conjunctiva/corneas clear, EOM's intact, fundi    benign, both eyes  Ears:    Normal TM's and external ear canals, both ears  Nose:   Nares normal, septum midline, mucosa normal, no drainage    or sinus tenderness  Throat:   Lips, mucosa, and tongue normal; teeth and gums normal  Neck:   Supple, symmetrical, trachea midline, no adenopathy;    Thyroid: no enlargement/tenderness/nodules  Back:     Symmetric, no curvature, ROM normal, no CVA tenderness  Lungs:     Clear to auscultation bilaterally, respirations unlabored  Chest Wall:    No tenderness or deformity   Heart:    Regular rate and rhythm, S1 and S2 normal, no murmur, rub   or gallop  Breast Exam:    Deferred to GYN  Abdomen:     Soft, non-tender, bowel sounds active all four quadrants,    no masses, no organomegaly  Genitalia:    Deferred to GYN  Rectal:    Extremities:   Extremities normal, atraumatic, no  cyanosis or edema  Pulses:   2+ and symmetric all extremities  Skin:   Skin color, texture, turgor normal, no rashes or lesions  Lymph nodes:   Cervical, supraclavicular, and axillary nodes normal  Neurologic:   CNII-XII intact, normal strength, sensation and reflexes    throughout          Assessment & Plan:

## 2018-01-24 NOTE — Patient Instructions (Signed)
Follow up in 1 year or as needed We'll notify you of your lab results and make any changes if needed Continue to work on healthy diet and regular exercise- you look great! Call with any questions or concerns Welcome!  We're glad to have you! Happy Holidays!!

## 2018-01-25 ENCOUNTER — Encounter: Payer: Self-pay | Admitting: General Practice

## 2018-04-30 IMAGING — NM NM HEPATO W/GB/PHARM/[PERSON_NAME]
2 series · 12 of 12 positions shown · non-contrast
Comparison: None.

CLINICAL DATA: Abdominal pain and nausea

EXAM:
NUCLEAR MEDICINE HEPATOBILIARY IMAGING WITH GALLBLADDER EF
VIEWS:
Anterior right upper quadrant
RADIOPHARMACEUTICALS:  5.0 mCi Gc-LLm  Choletec IV

[Series 1: biliary · 3.25mm/px · 6 of 60 frames shown]
[frame 6/60]
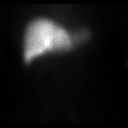
[frame 16/60]
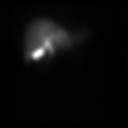
[frame 26/60]
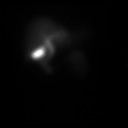
[frame 36/60]
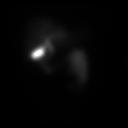
[frame 46/60]
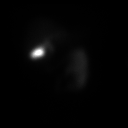
[frame 56/60]
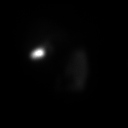

[Series 2: gbef · 3.25mm/px · 6 of 60 frames shown]
[frame 6/60]
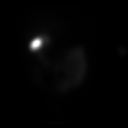
[frame 16/60]
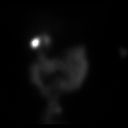
[frame 26/60]
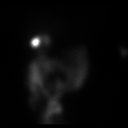
[frame 36/60]
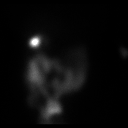
[frame 46/60]
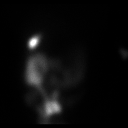
[frame 56/60]
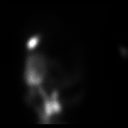

[12 of 12 positions shown; findings below may reference images not displayed]

FINDINGS: Liver uptake of radiotracer is normal. There is prompt visualization
of gallbladder and small bowel, indicating patency of the cystic and
common bile ducts. The patient consumed 8 ounces of Ensure orally
with calculation of the computer generated ejection fraction of
radiotracer from the gallbladder. There is no report of clinical
symptoms with the oral Ensure consumption. The computer generated
ejection fraction of radiotracer from the gallbladder is normal at
80%, normal greater than 33% using the oral agent.
IMPRESSION: Study within normal limits.

## 2018-05-14 IMAGING — US US ABDOMEN LIMITED
1 series · 14 of 25 positions shown · non-contrast
Comparison: 11/19/2016

ADDENDUM:
Impression should read as follows:  No acute abnormality identified.
CLINICAL DATA: Right upper quadrant pain

EXAM:
ULTRASOUND ABDOMEN LIMITED RIGHT UPPER QUADRANT

[Series 1: us abdomen limited · 0.18mm/px · 14 of 58 slices shown]
[im 1/58]
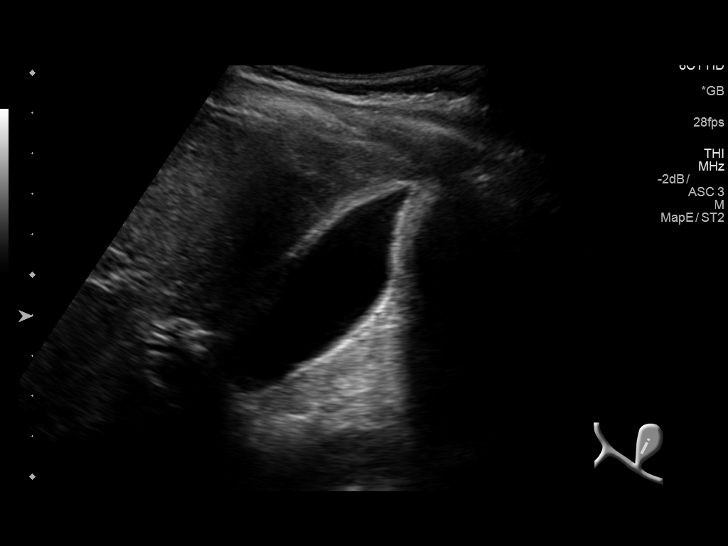
[im 5/58]
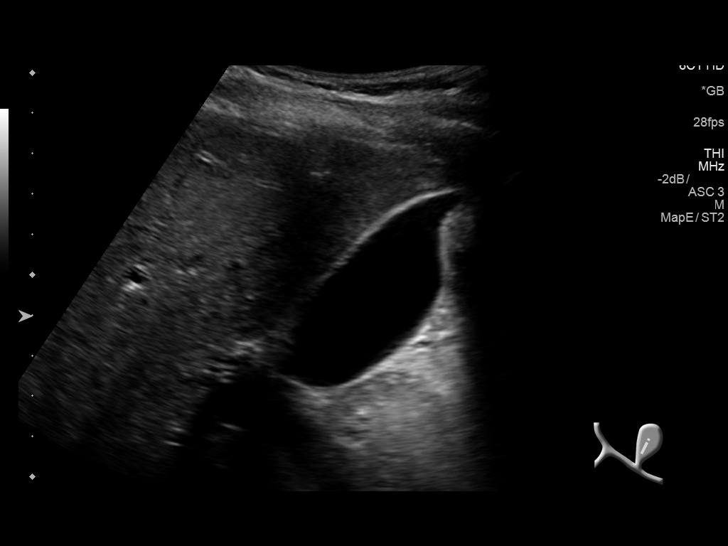
[im 10/58]
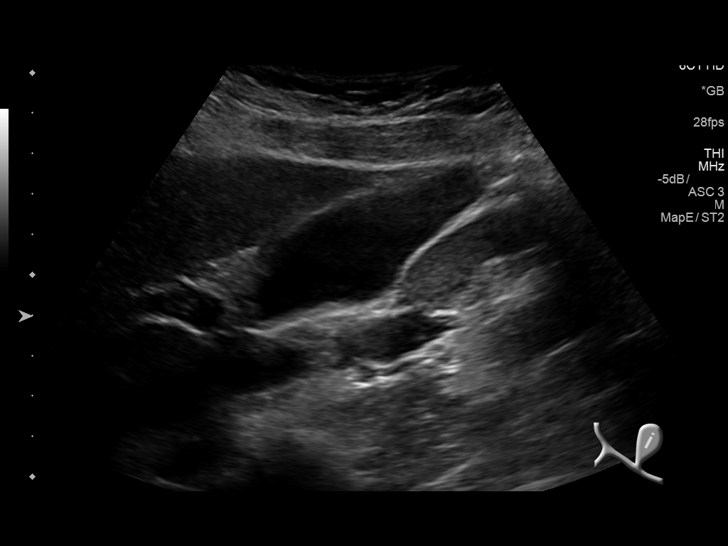
[im 15/58]
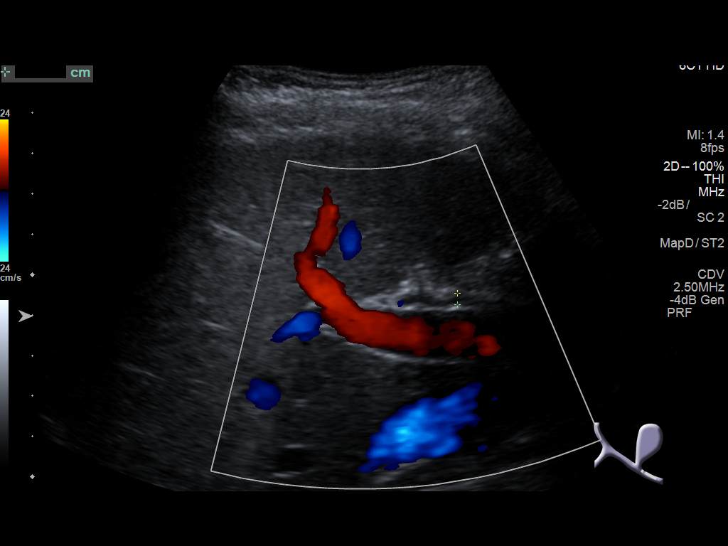
[im 20/58]
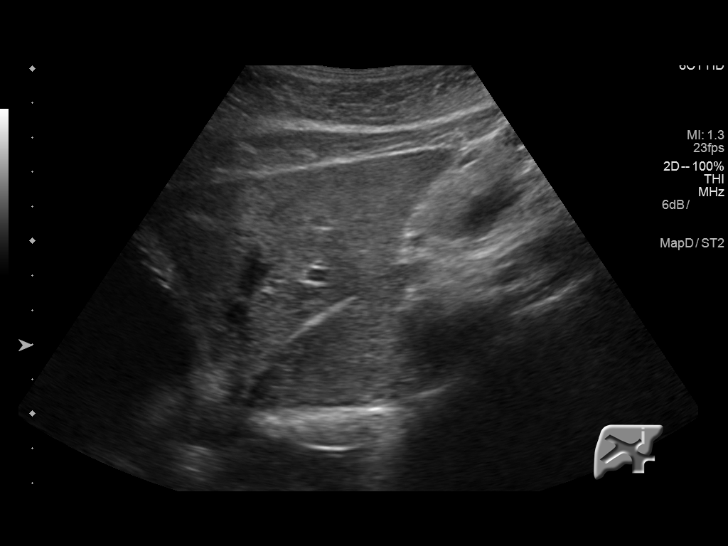
[im 22/58]
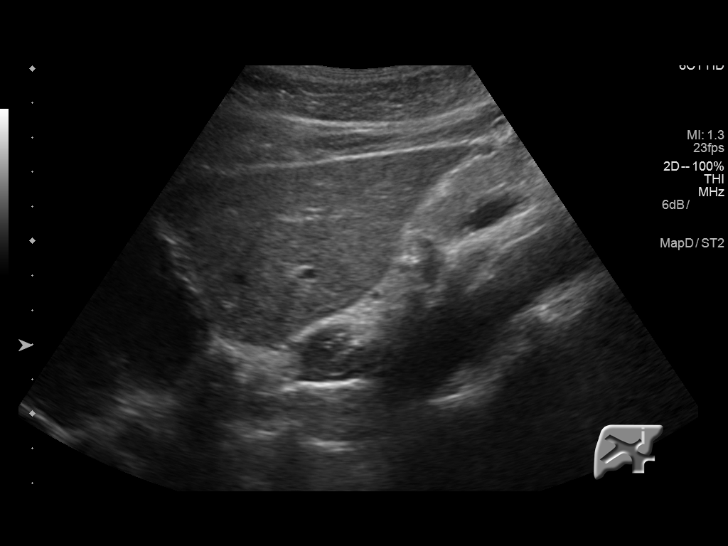
[im 27/58]
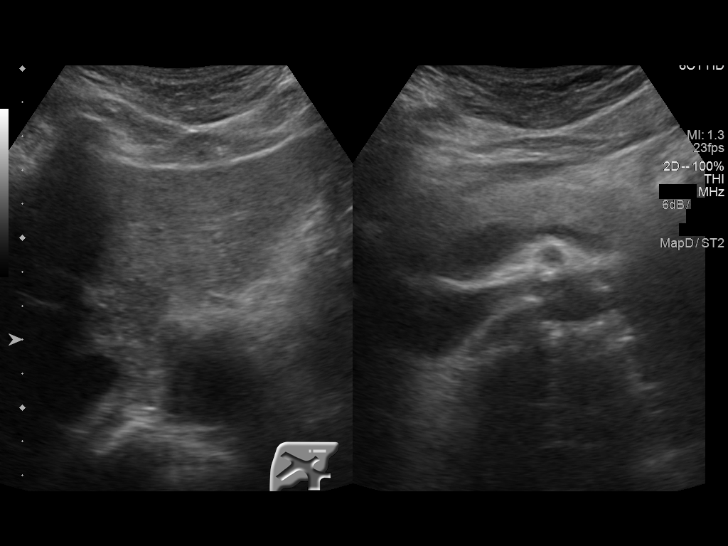
[im 31/58]
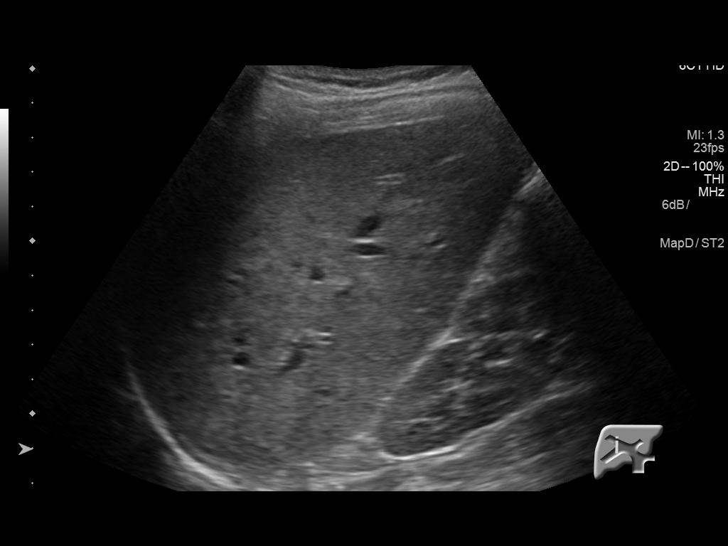
[im 36/58]
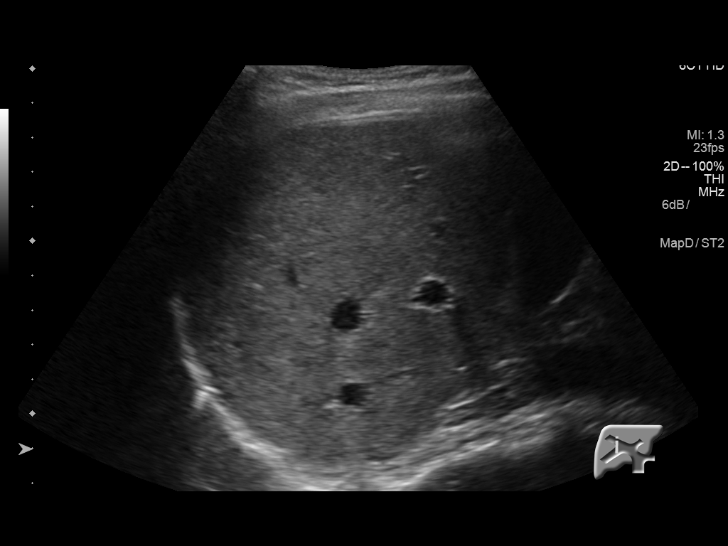
[im 39/58]
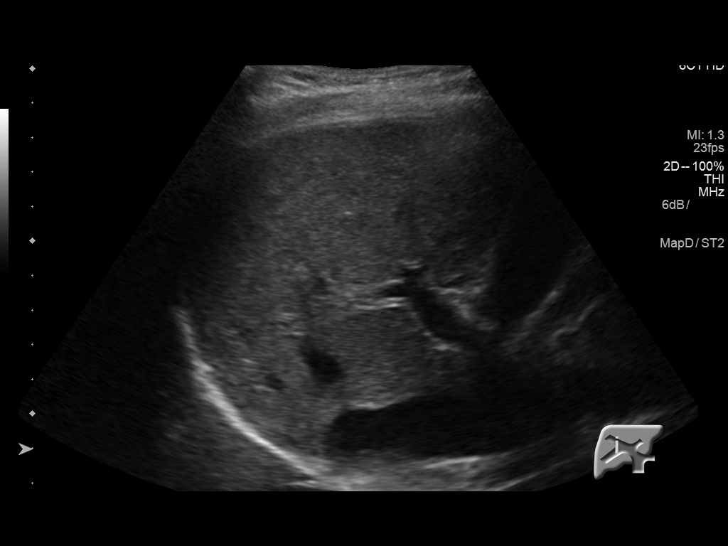
[im 43/58]
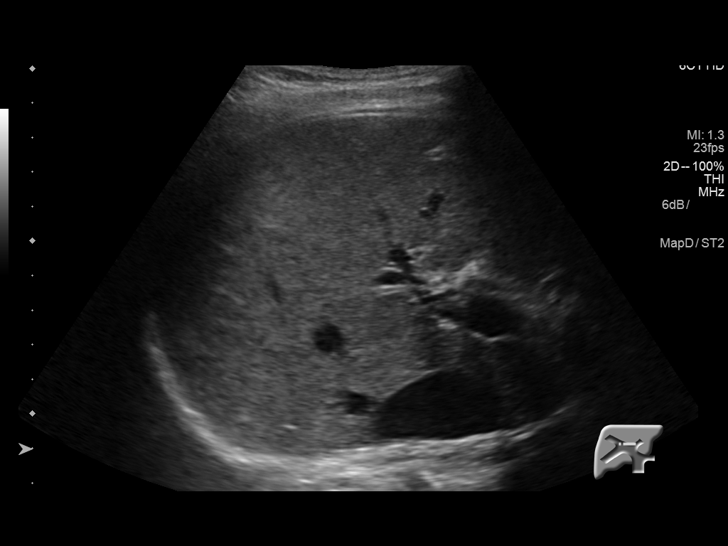
[im 48/58]
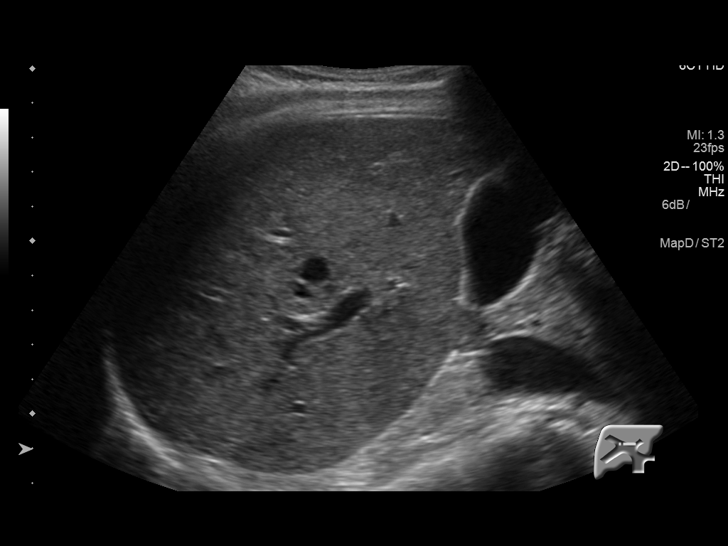
[im 53/58]
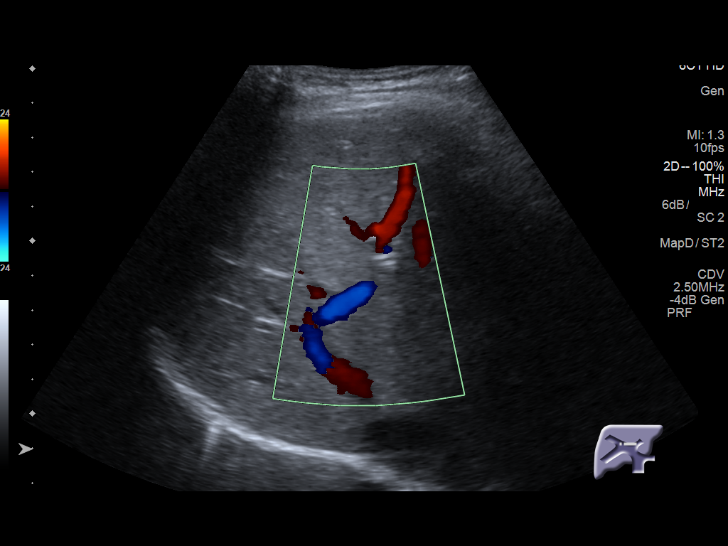
[im 58/58]
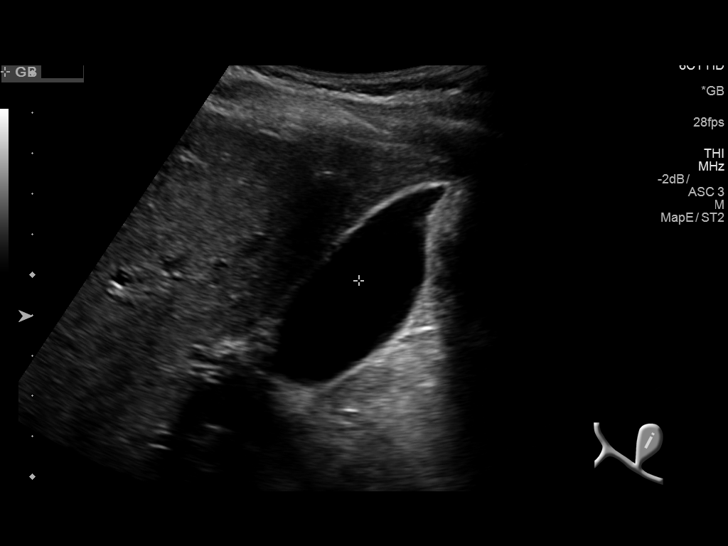

[14 of 25 positions shown; findings below may reference images not displayed]

FINDINGS: Gallbladder:

No gallstones or wall thickening visualized. No sonographic Murphy
sign noted by sonographer.

Common bile duct:

Diameter: 3.1 mm

Liver:

No focal lesion identified. Within normal limits in parenchymal
echogenicity. Portal vein is patent on color Doppler imaging with
normal direction of blood flow towards the liver.
IMPRESSION: Choose 1

## 2018-08-18 ENCOUNTER — Other Ambulatory Visit: Payer: Self-pay | Admitting: Family Medicine

## 2019-01-12 ENCOUNTER — Other Ambulatory Visit: Payer: Self-pay | Admitting: Family Medicine

## 2019-02-08 ENCOUNTER — Other Ambulatory Visit: Payer: Self-pay

## 2019-02-08 ENCOUNTER — Ambulatory Visit (INDEPENDENT_AMBULATORY_CARE_PROVIDER_SITE_OTHER): Payer: BLUE CROSS/BLUE SHIELD | Admitting: Family Medicine

## 2019-02-08 ENCOUNTER — Encounter: Payer: Self-pay | Admitting: Family Medicine

## 2019-02-08 VITALS — BP 119/77 | HR 77 | Ht 68.5 in | Wt 163.2 lb

## 2019-02-08 DIAGNOSIS — Z0000 Encounter for general adult medical examination without abnormal findings: Secondary | ICD-10-CM

## 2019-02-08 NOTE — Progress Notes (Signed)
I have discussed the procedure for the virtual visit with the patient who has given consent to proceed with assessment and treatment.   Jayko Voorhees L Deziray Nabi, CMA     

## 2019-02-08 NOTE — Progress Notes (Signed)
   Virtual Visit via Video   I connected with patient on 02/08/19 at  1:30 PM EST by a video enabled telemedicine application and verified that I am speaking with the correct person using two identifiers.  Location patient: Home Location provider: [facility]up[facility]ist, Office Persons participating in the virtual visit: Patient, Provider, [place] (Jess B)  I discussed the limitations of evaluation and management by telemedicine and the availability of in person appointments. The patient expressed understanding and agreed to proceed.  Subjective:   HPI:   CPE- UTD pap, mammo ([facility]) Tdap. Declines flu.  ROS:  Patient reports no vision/ hearing changes, adenopathy,fever, weight change,  persistant/recurrent hoarseness , swallowing issues, chest pain, palpitations, edema, persistant/recurrent cough, hemoptysis, dyspnea (rest/exertional/paroxysmal nocturnal), gastrointestinal bleeding (melena, rectal bleeding), abdominal pain, significant heartburn, bowel changes, GU symptoms (dysuria, hematuria, incontinence), Gyn symptoms (abnormal  bleeding, pain),  syncope, focal weakness, memory loss, numbness & tingling, skin/hair/nail changes, abnormal bruising or bleeding, anxiety, or depression.   Patient Active Problem List   Diagnosis Date Noted  . Physical exam 01/24/2018  . Word finding difficulty 10/21/2017  . Visual field constriction 10/21/2017  . Memory loss 10/21/2017  . Chronic cholecystitis   . Dyspepsia   . Abdominal pain 08/09/2016    Social History   Tobacco Use  . Smoking status: Former Smoker    Quit date: 01/24/1998    Years since quitting: 21.0  . Smokeless tobacco: Never Used  Substance Use Topics  . Alcohol use: Not Currently    Current Outpatient Medications:  .  Digestive Enzymes (DIGESTIVE ENZYME PO), Take 1 tablet by mouth daily., Disp: , Rfl:  .  ibuprofen (ADVIL,MOTRIN) 200 MG tablet, Take 400 mg by mouth every 6 (six) hours as needed for headache,  moderate pain or cramping., Disp: , Rfl:  .  Levonorgestrel (MIRENA IU), 1 each by Intrauterine route once. , Disp: , Rfl:  .  pantoprazole (PROTONIX) 40 MG tablet, TAKE 1 TABLET BY MOUTH EVERY DAY, Disp: 30 tablet, Rfl: 3  Allergies  Allergen Reactions  . Dilaudid [Hydromorphone] Other (See Comments)    Hallucinations   . Rhinocort [Budesonide] Palpitations and Other (See Comments)    Heart palpitations    Objective:   BP 119/77   Pulse 77   Ht 5' 8.5" (1.74 m)   Wt 163 lb 4 oz (74 kg)   BMI 24.46 kg/m  AAOx3, NAD NCAT, EOMI No obvious CN deficits Coloring WNL Pt is able to speak clearly, coherently without shortness of breath or increased work of breathing.  Thought process is linear.  Mood is appropriate.  Assessment and Plan:   CPE- UTD on Tdap, declines flu shot.  UTD on pap, mammo.  Limited video PE WNL.  Check labs.  Anticipatory guidance provided.    Annye Asa, MD 02/08/2019

## 2019-02-13 ENCOUNTER — Other Ambulatory Visit: Payer: Self-pay

## 2019-02-13 ENCOUNTER — Ambulatory Visit (INDEPENDENT_AMBULATORY_CARE_PROVIDER_SITE_OTHER): Payer: Self-pay

## 2019-02-13 DIAGNOSIS — Z0000 Encounter for general adult medical examination without abnormal findings: Secondary | ICD-10-CM

## 2019-02-14 ENCOUNTER — Encounter: Payer: Self-pay | Admitting: Family Medicine

## 2019-02-14 LAB — CBC WITH DIFFERENTIAL/PLATELET
Basophils Absolute: 0 10*3/uL (ref 0.0–0.1)
Basophils Relative: 0.5 % (ref 0.0–3.0)
Eosinophils Absolute: 0 10*3/uL (ref 0.0–0.7)
Eosinophils Relative: 0.5 % (ref 0.0–5.0)
HCT: 36.9 % (ref 36.0–46.0)
Hemoglobin: 12.2 g/dL (ref 12.0–15.0)
Lymphocytes Relative: 24.1 % (ref 12.0–46.0)
Lymphs Abs: 1.4 10*3/uL (ref 0.7–4.0)
MCHC: 33 g/dL (ref 30.0–36.0)
MCV: 87.5 fl (ref 78.0–100.0)
Monocytes Absolute: 0.6 10*3/uL (ref 0.1–1.0)
Monocytes Relative: 10.6 % (ref 3.0–12.0)
Neutro Abs: 3.8 10*3/uL (ref 1.4–7.7)
Neutrophils Relative %: 64.3 % (ref 43.0–77.0)
Platelets: 356 10*3/uL (ref 150.0–400.0)
RBC: 4.22 Mil/uL (ref 3.87–5.11)
RDW: 17 % — ABNORMAL HIGH (ref 11.5–15.5)
WBC: 5.9 10*3/uL (ref 4.0–10.5)

## 2019-02-14 LAB — LIPID PANEL
Cholesterol: 141 mg/dL (ref 0–200)
HDL: 44.3 mg/dL (ref >39.00)
LDL Cholesterol: 81 mg/dL (ref 0–99)
NonHDL: 96.84
Total CHOL/HDL Ratio: 3
Triglycerides: 77 mg/dL (ref 0.0–149.0)
VLDL: 15.4 mg/dL (ref 0.0–40.0)

## 2019-02-14 LAB — BASIC METABOLIC PANEL
BUN: 15 mg/dL (ref 6–23)
CO2: 25 mEq/L (ref 19–32)
Calcium: 9.6 mg/dL (ref 8.4–10.5)
Chloride: 102 mEq/L (ref 96–112)
Creatinine, Ser: 0.78 mg/dL (ref 0.40–1.20)
GFR: 80.19 mL/min (ref >60.00)
Glucose, Bld: 84 mg/dL (ref 70–99)
Potassium: 4.2 mEq/L (ref 3.5–5.1)
Sodium: 136 mEq/L (ref 135–145)

## 2019-02-14 LAB — HEPATIC FUNCTION PANEL
ALT: 13 U/L (ref 0–35)
AST: 15 U/L (ref 0–37)
Albumin: 4.3 g/dL (ref 3.5–5.2)
Alkaline Phosphatase: 68 U/L (ref 39–117)
Bilirubin, Direct: 0.1 mg/dL (ref 0.0–0.3)
Total Bilirubin: 0.5 mg/dL (ref 0.2–1.2)
Total Protein: 6.9 g/dL (ref 6.0–8.3)

## 2019-05-02 ENCOUNTER — Other Ambulatory Visit: Payer: Self-pay | Admitting: Family Medicine

## 2019-05-10 ENCOUNTER — Encounter: Payer: Self-pay | Admitting: Family Medicine

## 2019-05-10 DIAGNOSIS — R1013 Epigastric pain: Secondary | ICD-10-CM

## 2019-05-10 MED ORDER — PANTOPRAZOLE SODIUM 40 MG PO TBEC: 40.0000 mg | DELAYED_RELEASE_TABLET | Freq: Every day | ORAL | 1 refills | Status: DC

## 2019-10-31 ENCOUNTER — Other Ambulatory Visit: Payer: Self-pay | Admitting: Family Medicine

## 2019-10-31 DIAGNOSIS — R1013 Epigastric pain: Secondary | ICD-10-CM

## 2019-12-05 ENCOUNTER — Encounter: Payer: Self-pay | Admitting: Family Medicine

## 2019-12-05 ENCOUNTER — Ambulatory Visit (INDEPENDENT_AMBULATORY_CARE_PROVIDER_SITE_OTHER): Payer: 59 | Admitting: Family Medicine

## 2019-12-05 ENCOUNTER — Other Ambulatory Visit: Payer: Self-pay

## 2019-12-05 VITALS — BP 115/68 | HR 68 | Temp 97.3°F | Resp 16 | Ht 69.0 in | Wt 169.2 lb

## 2019-12-05 DIAGNOSIS — L720 Epidermal cyst: Secondary | ICD-10-CM

## 2019-12-05 DIAGNOSIS — K644 Residual hemorrhoidal skin tags: Secondary | ICD-10-CM

## 2019-12-05 MED ORDER — HYDROCORTISONE ACETATE 25 MG RE SUPP: 25.0000 mg | Freq: Two times a day (BID) | RECTAL | 0 refills | Status: DC

## 2019-12-05 MED ORDER — DIBUCAINE 1 % EX OINT: TOPICAL_OINTMENT | Freq: Three times a day (TID) | CUTANEOUS | 0 refills | Status: DC | PRN

## 2019-12-05 NOTE — Progress Notes (Signed)
° °  Subjective:    Patient ID: Angela Smith, female    DOB: 12-19-1975, 44 y.o.   MRN: [id number]  HPI Hemorrhoids- pt reports she has had current sxs ~10 days.  Has been using Tucks pads, sitz baths, Preparation H w/o relief.  Pt has long hx w/ hemorrhoids- has previously had surgery.  Struggles w/ constipation.  Was doing well on Mag Citrate/Mag Oxide combo but had dental work a few months ago and between the pain meds and needing to eat soft foods, she is again struggling w/ constipation.  + bleeding.  + pain, itching.  Not able to tolerate fiber due to upset stomach.  L groin cyst- pt reports area will flare and then calm down.  Can be very painful.  At times will drain small amount of pus.   Review of Systems For ROS see HPI     Objective:   Physical Exam Constitutional:      General: She is not in acute distress.    Appearance: Normal appearance. She is not ill-appearing or toxic-appearing.  HENT:     Head: Normocephalic and atraumatic.  Genitourinary:    Rectum: Tenderness (TTP over large external hemorrhoid) and external hemorrhoid present.  Skin:    Comments: Small cyst in L groin w/o evidence of infxn  Neurological:     General: No focal deficit present.     Mental Status: She is alert and oriented to person, place, and time.  Psychiatric:        Mood and Affect: Mood normal.        Behavior: Behavior normal.        Thought Content: Thought content normal.           Assessment & Plan:  External hemorrhoid- new to provider, ongoing for pt.  Start Hydrocortisone suppositories and nupercainal to help w/ pain/itching.  Pt expressed understanding and is in agreement w/ plan.   Groin cyst- reviewed dx and supportive care w/ pt (hot compresses, do not squeeze).  Discussed that definitive tx is excision.  She will call dermatology.

## 2019-12-05 NOTE — Patient Instructions (Signed)
Follow up as needed or as scheduled Use the Hydrocortisone suppository as directed Use the Nupercainal as needed for pain/itching Drink LOTS of water!!! Add Miralax or store brand equivalent 1-2x/day with goal of daily bowel movement Call with any questions or concerns Hang in there!!!

## 2019-12-25 ENCOUNTER — Encounter: Payer: Self-pay | Admitting: Family Medicine

## 2020-02-12 ENCOUNTER — Encounter: Payer: 59 | Admitting: Family Medicine

## 2020-03-06 ENCOUNTER — Ambulatory Visit (INDEPENDENT_AMBULATORY_CARE_PROVIDER_SITE_OTHER): Payer: 59 | Admitting: Family Medicine

## 2020-03-06 ENCOUNTER — Encounter: Payer: Self-pay | Admitting: Family Medicine

## 2020-03-06 ENCOUNTER — Other Ambulatory Visit: Payer: Self-pay

## 2020-03-06 VITALS — BP 120/70 | HR 71 | Temp 98.7°F | Resp 19 | Ht 69.0 in | Wt 171.2 lb

## 2020-03-06 DIAGNOSIS — Z0000 Encounter for general adult medical examination without abnormal findings: Secondary | ICD-10-CM

## 2020-03-06 DIAGNOSIS — E663 Overweight: Secondary | ICD-10-CM | POA: Diagnosis not present

## 2020-03-06 DIAGNOSIS — D225 Melanocytic nevi of trunk: Secondary | ICD-10-CM | POA: Diagnosis not present

## 2020-03-06 LAB — BASIC METABOLIC PANEL
BUN: 8 mg/dL (ref 6–23)
CO2: 30 mEq/L (ref 19–32)
Calcium: 9.8 mg/dL (ref 8.4–10.5)
Chloride: 102 mEq/L (ref 96–112)
Creatinine, Ser: 0.78 mg/dL (ref 0.40–1.20)
GFR: 91.9 mL/min (ref >60.00)
Glucose, Bld: 69 mg/dL — ABNORMAL LOW (ref 70–99)
Potassium: 4.2 mEq/L (ref 3.5–5.1)
Sodium: 136 mEq/L (ref 135–145)

## 2020-03-06 LAB — CBC WITH DIFFERENTIAL/PLATELET
Basophils Absolute: 0.1 10*3/uL (ref 0.0–0.1)
Basophils Relative: 1.2 % (ref 0.0–3.0)
Eosinophils Absolute: 0 10*3/uL (ref 0.0–0.7)
Eosinophils Relative: 0.6 % (ref 0.0–5.0)
HCT: 37.5 % (ref 36.0–46.0)
Hemoglobin: 12.7 g/dL (ref 12.0–15.0)
Lymphocytes Relative: 29.2 % (ref 12.0–46.0)
Lymphs Abs: 1.7 10*3/uL (ref 0.7–4.0)
MCHC: 33.8 g/dL (ref 30.0–36.0)
MCV: 93 fl (ref 78.0–100.0)
Monocytes Absolute: 0.5 10*3/uL (ref 0.1–1.0)
Monocytes Relative: 7.8 % (ref 3.0–12.0)
Neutro Abs: 3.6 10*3/uL (ref 1.4–7.7)
Neutrophils Relative %: 61.2 % (ref 43.0–77.0)
Platelets: 385 10*3/uL (ref 150.0–400.0)
RBC: 4.04 Mil/uL (ref 3.87–5.11)
RDW: 13.3 % (ref 11.5–15.5)
WBC: 5.8 10*3/uL (ref 4.0–10.5)

## 2020-03-06 LAB — TSH: TSH: 2.72 u[IU]/mL (ref 0.35–4.50)

## 2020-03-06 LAB — LIPID PANEL
Cholesterol: 187 mg/dL (ref 0–200)
HDL: 61 mg/dL (ref >39.00)
LDL Cholesterol: 116 mg/dL — ABNORMAL HIGH (ref 0–99)
NonHDL: 125.84
Total CHOL/HDL Ratio: 3
Triglycerides: 51 mg/dL (ref 0.0–149.0)
VLDL: 10.2 mg/dL (ref 0.0–40.0)

## 2020-03-06 LAB — HEPATIC FUNCTION PANEL
ALT: 19 U/L (ref 0–35)
AST: 18 U/L (ref 0–37)
Albumin: 4.4 g/dL (ref 3.5–5.2)
Alkaline Phosphatase: 68 U/L (ref 39–117)
Bilirubin, Direct: 0.1 mg/dL (ref 0.0–0.3)
Total Bilirubin: 0.5 mg/dL (ref 0.2–1.2)
Total Protein: 7.5 g/dL (ref 6.0–8.3)

## 2020-03-06 NOTE — Patient Instructions (Addendum)
Follow up in 1 year of as needed We'll notify you of your lab results and make any changes if needed Continue to work on healthy diet and regular exercise- you're doing great!! We'll call you with your Dermatology referral Call with any questions or concerns Stay Safe!  Stay Healthy! Happy Spring!!!

## 2020-03-06 NOTE — Assessment & Plan Note (Signed)
Pt's PE WNL.  UTD on pap, mammo.  Declines colon cancer screen, flu, and COVID vaccines.  Check labs.  Anticipatory guidance provided.

## 2020-03-06 NOTE — Progress Notes (Signed)
   Subjective:    Patient ID: Angela Smith, female    DOB: May 15, 1975, 45 y.o.   MRN: [id number]  HPI CPE- UTD on pap, mammo.  Pt declines flu and COVID vaccines.  UTD on Tdap.  Pt is not interested in colon cancer screening at this time  Reviewed past medical, surgical, family and social histories.   Patient Care Team    Relationship Specialty Notifications Start End  Midge Minium, MD PCP - General Family Medicine  01/24/18   Everlene Farrier, MD Consulting Physician Obstetrics and Gynecology  12/01/13   Wilburt Finlay, [place] Referring Physician Optometry  12/01/13   Martinique, Amy, MD Consulting Physician Dermatology  12/01/13   Danie Binder, MD (Inactive) Consulting Physician Gastroenterology  05/12/17     Health Maintenance  Topic Date Due  . PAP SMEAR-Modifier  04/24/2020  . COVID-19 Vaccine (1) 03/22/2020 (Originally 01/09/1987)  . INFLUENZA VACCINE  05/09/2020 (Originally 09/10/2019)  . COLONOSCOPY (Pts 45-20yrs Insurance coverage will need to be confirmed)  03/06/2021 (Originally 01/09/2020)  . Hepatitis C Screening  03/06/2021 (Originally 1975/03/22)  . HIV Screening  03/06/2021 (Originally 01/08/1990)  . TETANUS/TDAP  11/29/2022      Review of Systems Patient reports no vision/ hearing changes, adenopathy,fever, weight change,  persistant/recurrent hoarseness , swallowing issues, chest pain, palpitations, edema, persistant/recurrent cough, hemoptysis, dyspnea (rest/exertional/paroxysmal nocturnal), gastrointestinal bleeding (melena, rectal bleeding), abdominal pain, significant heartburn, bowel changes, GU symptoms (dysuria, hematuria, incontinence), Gyn symptoms (abnormal  bleeding, pain),  syncope, focal weakness, memory loss, numbness & tingling, skin/hair changes, abnormal bruising or bleeding, anxiety, or depression.   + brittle nails  This visit occurred during the SARS-CoV-2 public health emergency.  Safety protocols were in place, including screening questions  prior to the visit, additional usage of staff PPE, and extensive cleaning of exam room while observing appropriate contact time as indicated for disinfecting solutions.       Objective:   Physical Exam General Appearance:    Alert, cooperative, no distress, appears stated age  Head:    Normocephalic, without obvious abnormality, atraumatic  Eyes:    PERRL, conjunctiva/corneas clear, EOM's intact, fundi    benign, both eyes  Ears:    Normal TM's and external ear canals, both ears  Nose:   Deferred due to COVID  Throat:   Neck:   Supple, symmetrical, trachea midline, no adenopathy;    Thyroid: no enlargement/tenderness/nodules  Back:     Symmetric, no curvature, ROM normal, no CVA tenderness  Lungs:     Clear to auscultation bilaterally, respirations unlabored  Chest Wall:    No tenderness or deformity   Heart:    Regular rate and rhythm, S1 and S2 normal, no murmur, rub   or gallop  Breast Exam:    Deferred to GYN  Abdomen:     Soft, non-tender, bowel sounds active all four quadrants,    no masses, no organomegaly  Genitalia:    Deferred to GYN  Rectal:    Extremities:   Extremities normal, atraumatic, no cyanosis or edema  Pulses:   2+ and symmetric all extremities  Skin:   Skin color, texture, turgor normal, no rashes or lesions  Lymph nodes:   Cervical, supraclavicular, and axillary nodes normal  Neurologic:   CNII-XII intact, normal strength, sensation and reflexes    throughout          Assessment & Plan:

## 2020-03-07 ENCOUNTER — Telehealth: Payer: Self-pay | Admitting: Family Medicine

## 2020-03-07 ENCOUNTER — Other Ambulatory Visit: Payer: Self-pay

## 2020-03-07 DIAGNOSIS — Z1211 Encounter for screening for malignant neoplasm of colon: Secondary | ICD-10-CM

## 2020-03-07 NOTE — Telephone Encounter (Signed)
Ok to place referral to [facility], Dr Aviva Signs (dx colon cancer screening)

## 2020-03-07 NOTE — Telephone Encounter (Signed)
Referral placed for patient, Dx: colon cancer screening

## 2020-03-07 NOTE — Telephone Encounter (Signed)
Pt called in stating the at she would like to go ahead with the colonoscopy. She would like to get the done at   [facility] [place], [place] [id number] [phone]  Dr. Aviva Signs

## 2020-03-21 ENCOUNTER — Other Ambulatory Visit: Payer: Self-pay

## 2020-03-21 ENCOUNTER — Ambulatory Visit (INDEPENDENT_AMBULATORY_CARE_PROVIDER_SITE_OTHER): Payer: 59 | Admitting: General Surgery

## 2020-03-21 ENCOUNTER — Encounter: Payer: Self-pay | Admitting: General Surgery

## 2020-03-21 VITALS — BP 114/67 | HR 75 | Temp 97.2°F | Resp 14 | Ht 69.0 in | Wt 171.0 lb

## 2020-03-21 DIAGNOSIS — Z1211 Encounter for screening for malignant neoplasm of colon: Secondary | ICD-10-CM | POA: Diagnosis not present

## 2020-03-21 MED ORDER — SUTAB 1479-225-188 MG PO TABS: 1.0000 | ORAL_TABLET | Freq: Once | ORAL | 0 refills | Status: AC

## 2020-03-21 NOTE — Patient Instructions (Signed)
Colonoscopy, Adult A colonoscopy is a procedure to look at the entire large intestine. This procedure is done using a long, thin, flexible tube that has a camera on the end. You may have a colonoscopy:  As a part of normal colorectal screening.  If you have certain symptoms, such as: ? A low number of red blood cells in your blood (anemia). ? Diarrhea that does not go away. ? Pain in your abdomen. ? Blood in your stool. A colonoscopy can help screen for and diagnose medical problems, including:  Tumors.  Extra tissue that grows where mucus forms (polyps).  Inflammation.  Areas of bleeding. Tell your health care provider about:  Any allergies you have.  All medicines you are taking, including vitamins, herbs, eye drops, creams, and over-the-counter medicines.  Any problems you or family members have had with anesthetic medicines.  Any blood disorders you have.  Any surgeries you have had.  Any medical conditions you have.  Any problems you have had with having bowel movements.  Whether you are pregnant or may be pregnant. What are the risks? Generally, this is a safe procedure. However, problems may occur, including:  Bleeding.  Damage to your intestine.  Allergic reactions to medicines given during the procedure.  Infection. This is rare. What happens before the procedure? Eating and drinking restrictions Follow instructions from your health care provider about eating or drinking restrictions, which may include:  A few days before the procedure: ? Follow a low-fiber diet. ? Avoid nuts, seeds, dried fruit, raw fruits, and vegetables.  1-3 days before the procedure: ? Eat only gelatin dessert or ice pops. ? Drink only clear liquids, such as water, clear juice, clear broth or bouillon, black coffee or tea, or clear soft drinks or sports drinks. ? Avoid liquids that contain red or purple dye.  The day of the procedure: Do not eat solid foods. Bowel prep If  you were prescribed a bowel prep to take by mouth (orally) to clean out your colon:  Take it as told by your health care provider. Starting the day before your procedure, you will need to drink a large amount of liquid medicine. The liquid will cause you to have many bowel movements of loose stool until your stool becomes almost clear or light green.  If your skin or the opening between the buttocks (anus) gets irritated from diarrhea, you may relieve the irritation using: ? Wipes with medicine in them, such as adult wet wipes with aloe and vitamin E. ? A product to soothe skin, such as petroleum jelly.  If you vomit while drinking the bowel prep: ? Take a break for up to 60 minutes. ? Begin the bowel prep again. ? Call your health care provider if you keep vomiting or you cannot take the bowel prep without vomiting.  To clean out your colon, you may also be given: ? Laxative medicines. These help you have a bowel movement. ? Instructions for enema use. An enema is liquid medicine injected into your rectum. Medicines Ask your health care provider about:  Changing or stopping your regular medicines or supplements. This is especially important if you are taking iron supplements, diabetes medicines, or blood thinners.  Taking medicines such as aspirin and ibuprofen. These medicines can thin your blood. Do not take these medicines unless your health care provider tells you to take them.  Taking over-the-counter medicines, vitamins, herbs, and supplements. General instructions  Ask your health care provider what steps will be taken  to help prevent infection. These may include washing skin with a germ-killing soap.  Plan to have someone take you home from the hospital or clinic. What happens during the procedure?  An IV will be inserted into one of your veins.  You may be given one or more of the following: ? A medicine to help you relax (sedative). ? A medicine to numb the area (local  anesthetic). ? A medicine to make you fall asleep (general anesthetic). This is rarely needed.  You will lie on your side with your knees bent.  The tube will: ? Have oil or gel put on it (be lubricated). ? Be inserted into your anus. ? Be gently eased through all parts of your large intestine.  Air will be sent into your colon to keep it open. This may cause some pressure or cramping.  Images will be taken with the camera and will appear on a screen.  A small tissue sample may be removed to be looked at under a microscope (biopsy). The tissue may be sent to a lab for testing if any signs of problems are found.  If small polyps are found, they may be removed and checked for cancer cells.  When the procedure is finished, the tube will be removed. The procedure may vary among health care providers and hospitals.   What happens after the procedure?  Your blood pressure, heart rate, breathing rate, and blood oxygen level will be monitored until you leave the hospital or clinic.  You may have a small amount of blood in your stool.  You may pass gas and have mild cramping or bloating in your abdomen. This is caused by the air that was used to open your colon during the exam.  Do not drive for 24 hours after the procedure.  It is up to you to get the results of your procedure. Ask your health care provider, or the department that is doing the procedure, when your results will be ready. Summary  A colonoscopy is a procedure to look at the entire large intestine.  Follow instructions from your health care provider about eating and drinking before the procedure.  If you were prescribed an oral bowel prep to clean out your colon, take it as told by your health care provider.  During the colonoscopy, a flexible tube with a camera on its end is inserted into the anus and then passed into the other parts of the large intestine. This information is not intended to replace advice given to you  by your health care provider. Make sure you discuss any questions you have with your health care provider. Document Revised: 08/19/2018 Document Reviewed: 08/19/2018 Elsevier Patient Education  [place].

## 2020-03-22 NOTE — Progress Notes (Signed)
Angela Smith; [id number]; Mar 25, 1975   HPI Patient is a 45 year old white female who was referred to my care by Dr. Birdie Riddle for a screening colonoscopy.  Patient denies any family history of colon cancer.  She denies any significant blood per rectum.  She has had issues with IBS and varies between diarrhea and constipation.  She has had this issue for many years.  She also has had some reflux disease but recently it has been controlled.  She had an EGD performed approximately 3-1/2 years ago. Past Medical History:  Diagnosis Date  . Allergy    ALLERGIC RHINITS  . Fibrocystic breast disease   . Hyperlipidemia    DIET/ HERBAL CONTROLED  . Memory difficulty   . Miscarriage 2006   TWO SEPERATE 4 MONTHS APART  . 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 On no meds and no more seizures.    Past Surgical History:  Procedure Laterality Date  . APPENDECTOMY  2018  . CHOLECYSTECTOMY N/A 06/04/2017   Procedure: LAPAROSCOPIC CHOLECYSTECTOMY;  Surgeon: Aviva Signs, MD;  Location: [facility];  Service: General;  Laterality: N/A;  . DILATION AND CURETTAGE OF UTERUS  2006   MISCARRIAGE  . ESOPHAGOGASTRODUODENOSCOPY N/A 01/11/2017   mild gastritis, normal small bowel  . KNEE ARTHROSCOPY Left    X 2  . LAPAROSCOPIC APPENDECTOMY N/A 08/10/2016   Procedure: APPENDECTOMY LAPAROSCOPIC;  Surgeon: Aviva Signs, MD;  Location: [facility];  Service: General;  Laterality: N/A;    Family History  Problem Relation Age of Onset  . Depression Mother   . Hypertension Father   . Hyperlipidemia Father   . Healthy Brother   . Healthy Daughter   . Healthy Daughter   . Diabetes Maternal Grandmother   . Osteoporosis Maternal Grandmother   . Arthritis Maternal Grandmother   . Asthma Maternal Grandmother   . COPD Maternal Grandmother   . Hyperlipidemia Maternal Grandmother   . Hypertension Maternal Grandmother   . Heart disease Maternal Grandfather   . Hyperlipidemia Maternal Grandfather   . Cancer  Maternal Grandfather        PANCREATIC/ BONE  . Cancer Paternal Grandfather        THROAT  . Diabetes Paternal Grandfather   . Colon cancer Neg Hx   . Colon polyps Neg Hx     Current Outpatient Medications on File Prior to Visit  Medication Sig Dispense Refill  . Digestive Enzymes (DIGESTIVE ENZYME PO) Take 1 tablet by mouth daily.    . Levonorgestrel (MIRENA IU) 1 each by Intrauterine route once.     . pantoprazole (PROTONIX) 40 MG tablet TAKE 1 TABLET BY MOUTH EVERY DAY 90 tablet 1   No current facility-administered medications on file prior to visit.    Allergies  Allergen Reactions  . Dilaudid [Hydromorphone] Other (See Comments)    Hallucinations   . Rhinocort [Budesonide] Palpitations and Other (See Comments)    Heart palpitations    Social History   Substance and Sexual Activity  Alcohol Use Not Currently    Social History   Tobacco Use  Smoking Status Former Smoker  . Quit date: 01/24/1998  . Years since quitting: 22.1  Smokeless Tobacco Never Used    Review of Systems  Constitutional: Negative.   HENT: Negative.   Eyes: Negative.   Respiratory: Negative.   Cardiovascular: Negative.   Gastrointestinal: Positive for heartburn.  Genitourinary: Negative.   Musculoskeletal: Negative.   Skin: Negative.   Neurological: Negative.   Endo/Heme/Allergies: Negative.  Psychiatric/Behavioral: Negative.     Objective   Vitals:   03/21/20 0917  BP: 114/67  Pulse: 75  Resp: 14  Temp: (!) 97.2 F (36.2 C)  SpO2: 98%    Physical Exam Vitals reviewed.  Constitutional:      General: She is not in acute distress.    Appearance: Normal appearance. She is normal weight. She is not ill-appearing.  HENT:     Head: Normocephalic and atraumatic.  Cardiovascular:     Rate and Rhythm: Normal rate and regular rhythm.     Heart sounds: Normal heart sounds. No murmur heard. No friction rub. No gallop.   Pulmonary:     Effort: Pulmonary effort is normal. No  respiratory distress.     Breath sounds: Normal breath sounds. No stridor. No wheezing, rhonchi or rales.  Abdominal:     General: Bowel sounds are normal. There is no distension.     Palpations: Abdomen is soft. There is no mass.     Tenderness: There is no abdominal tenderness. There is no guarding or rebound.     Hernia: No hernia is present.  Skin:    General: Skin is warm and dry.  Neurological:     Mental Status: She is alert and oriented to person, place, and time.     Assessment  Need for screening colonoscopy Plan   Patient is scheduled for screening colonoscopy on 04/02/2020.  The risks and benefits of the procedure including bleeding and perforation were fully explained to the patient, who gave informed consent.  Suprep tabs have been prescribed for preoperative bowel prep.

## 2020-03-22 NOTE — H&P (Signed)
Angela Smith; [id number]; [birth year]   HPI Patient is a 45 year old white female who was referred to my care by Dr. Birdie Riddle for a screening colonoscopy.  Patient denies any family history of colon cancer.  She denies any significant blood per rectum.  She has had issues with IBS and varies between diarrhea and constipation.  She has had this issue for many years.  She also has had some reflux disease but recently it has been controlled.  She had an EGD performed approximately 3-1/2 years ago. Past Medical History:  Diagnosis Date  . Allergy    ALLERGIC RHINITS  . Fibrocystic breast disease   . Hyperlipidemia    DIET/ HERBAL CONTROLED  . Memory difficulty   . Miscarriage 2006   TWO SEPERATE 4 MONTHS APART  . 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 On no meds and no more seizures.    Past Surgical History:  Procedure Laterality Date  . APPENDECTOMY  2018  . CHOLECYSTECTOMY N/A 06/04/2017   Procedure: LAPAROSCOPIC CHOLECYSTECTOMY;  Surgeon: Aviva Signs, MD;  Location: [facility];  Service: General;  Laterality: N/A;  . DILATION AND CURETTAGE OF UTERUS  2006   MISCARRIAGE  . ESOPHAGOGASTRODUODENOSCOPY N/A 01/11/2017   mild gastritis, normal small bowel  . KNEE ARTHROSCOPY Left    X 2  . LAPAROSCOPIC APPENDECTOMY N/A 08/10/2016   Procedure: APPENDECTOMY LAPAROSCOPIC;  Surgeon: Aviva Signs, MD;  Location: [facility];  Service: General;  Laterality: N/A;    Family History  Problem Relation Age of Onset  . Depression Mother   . Hypertension Father   . Hyperlipidemia Father   . Healthy Brother   . Healthy Daughter   . Healthy Daughter   . Diabetes Maternal Grandmother   . Osteoporosis Maternal Grandmother   . Arthritis Maternal Grandmother   . Asthma Maternal Grandmother   . COPD Maternal Grandmother   . Hyperlipidemia Maternal Grandmother   . Hypertension Maternal Grandmother   . Heart disease Maternal Grandfather   . Hyperlipidemia Maternal Grandfather   . Cancer  Maternal Grandfather        PANCREATIC/ BONE  . Cancer Paternal Grandfather        THROAT  . Diabetes Paternal Grandfather   . Colon cancer Neg Hx   . Colon polyps Neg Hx     Current Outpatient Medications on File Prior to Visit  Medication Sig Dispense Refill  . Digestive Enzymes (DIGESTIVE ENZYME PO) Take 1 tablet by mouth daily.    . Levonorgestrel (MIRENA IU) 1 each by Intrauterine route once.     . pantoprazole (PROTONIX) 40 MG tablet TAKE 1 TABLET BY MOUTH EVERY DAY 90 tablet 1   No current facility-administered medications on file prior to visit.    Allergies  Allergen Reactions  . Dilaudid [Hydromorphone] Other (See Comments)    Hallucinations   . Rhinocort [Budesonide] Palpitations and Other (See Comments)    Heart palpitations    Social History   Substance and Sexual Activity  Alcohol Use Not Currently    Social History   Tobacco Use  Smoking Status Former Smoker  . Quit date: 01/24/1998  . Years since quitting: 22.1  Smokeless Tobacco Never Used    Review of Systems  Constitutional: Negative.   HENT: Negative.   Eyes: Negative.   Respiratory: Negative.   Cardiovascular: Negative.   Gastrointestinal: Positive for heartburn.  Genitourinary: Negative.   Musculoskeletal: Negative.   Skin: Negative.   Neurological: Negative.   Endo/Heme/Allergies: Negative.  Psychiatric/Behavioral: Negative.     Objective   Vitals:   03/21/20 0917  BP: 114/67  Pulse: 75  Resp: 14  Temp: (!) 97.2 F (36.2 C)  SpO2: 98%    Physical Exam Vitals reviewed.  Constitutional:      General: She is not in acute distress.    Appearance: Normal appearance. She is normal weight. She is not ill-appearing.  HENT:     Head: Normocephalic and atraumatic.  Cardiovascular:     Rate and Rhythm: Normal rate and regular rhythm.     Heart sounds: Normal heart sounds. No murmur heard. No friction rub. No gallop.   Pulmonary:     Effort: Pulmonary effort is normal. No  respiratory distress.     Breath sounds: Normal breath sounds. No stridor. No wheezing, rhonchi or rales.  Abdominal:     General: Bowel sounds are normal. There is no distension.     Palpations: Abdomen is soft. There is no mass.     Tenderness: There is no abdominal tenderness. There is no guarding or rebound.     Hernia: No hernia is present.  Skin:    General: Skin is warm and dry.  Neurological:     Mental Status: She is alert and oriented to person, place, and time.     Assessment  Need for screening colonoscopy Plan   Patient is scheduled for screening colonoscopy on 04/02/2020.  The risks and benefits of the procedure including bleeding and perforation were fully explained to the patient, who gave informed consent.  Suprep tabs have been prescribed for preoperative bowel prep.

## 2020-04-01 ENCOUNTER — Other Ambulatory Visit (HOSPITAL_COMMUNITY)
Admission: RE | Admit: 2020-04-01 | Discharge: 2020-04-01 | Disposition: A | Discharge status: Home / Self Care | Payer: 59 | Source: Ambulatory Visit | Attending: General Surgery | Admitting: General Surgery

## 2020-04-01 ENCOUNTER — Other Ambulatory Visit: Payer: Self-pay

## 2020-04-01 DIAGNOSIS — Z87891 Personal history of nicotine dependence: Secondary | ICD-10-CM | POA: Diagnosis not present

## 2020-04-01 DIAGNOSIS — Z885 Allergy status to narcotic agent status: Secondary | ICD-10-CM | POA: Diagnosis not present

## 2020-04-01 DIAGNOSIS — K649 Unspecified hemorrhoids: Secondary | ICD-10-CM | POA: Diagnosis not present

## 2020-04-01 DIAGNOSIS — Z975 Presence of (intrauterine) contraceptive device: Secondary | ICD-10-CM | POA: Diagnosis not present

## 2020-04-01 DIAGNOSIS — Z1211 Encounter for screening for malignant neoplasm of colon: Secondary | ICD-10-CM | POA: Diagnosis not present

## 2020-04-01 DIAGNOSIS — Z20822 Contact with and (suspected) exposure to covid-19: Secondary | ICD-10-CM | POA: Diagnosis not present

## 2020-04-01 DIAGNOSIS — Z79899 Other long term (current) drug therapy: Secondary | ICD-10-CM | POA: Diagnosis not present

## 2020-04-01 DIAGNOSIS — Z888 Allergy status to other drugs, medicaments and biological substances status: Secondary | ICD-10-CM | POA: Diagnosis not present

## 2020-04-01 LAB — SARS CORONAVIRUS 2 BY RT PCR (HOSPITAL ORDER, PERFORMED IN ~~LOC~~ HOSPITAL LAB): SARS Coronavirus 2: NEGATIVE

## 2020-04-02 ENCOUNTER — Encounter (HOSPITAL_COMMUNITY)
Admission: RE | Disposition: A | Discharge status: Home / Self Care | Payer: Self-pay | Source: Home / Self Care | Attending: General Surgery

## 2020-04-02 ENCOUNTER — Encounter (HOSPITAL_COMMUNITY): Payer: Self-pay | Admitting: General Surgery

## 2020-04-02 ENCOUNTER — Other Ambulatory Visit: Payer: Self-pay

## 2020-04-02 ENCOUNTER — Ambulatory Visit (HOSPITAL_COMMUNITY)
Admission: RE | Admit: 2020-04-02 | Discharge: 2020-04-02 | Disposition: A | Discharge status: Home / Self Care | Payer: 59 | Attending: General Surgery | Admitting: General Surgery

## 2020-04-02 DIAGNOSIS — Z885 Allergy status to narcotic agent status: Secondary | ICD-10-CM | POA: Insufficient documentation

## 2020-04-02 DIAGNOSIS — Z888 Allergy status to other drugs, medicaments and biological substances status: Secondary | ICD-10-CM | POA: Insufficient documentation

## 2020-04-02 DIAGNOSIS — K649 Unspecified hemorrhoids: Secondary | ICD-10-CM | POA: Diagnosis not present

## 2020-04-02 DIAGNOSIS — Z1211 Encounter for screening for malignant neoplasm of colon: Secondary | ICD-10-CM | POA: Diagnosis not present

## 2020-04-02 DIAGNOSIS — Z20822 Contact with and (suspected) exposure to covid-19: Secondary | ICD-10-CM | POA: Insufficient documentation

## 2020-04-02 DIAGNOSIS — Z79899 Other long term (current) drug therapy: Secondary | ICD-10-CM | POA: Insufficient documentation

## 2020-04-02 DIAGNOSIS — Z975 Presence of (intrauterine) contraceptive device: Secondary | ICD-10-CM | POA: Insufficient documentation

## 2020-04-02 DIAGNOSIS — Z87891 Personal history of nicotine dependence: Secondary | ICD-10-CM | POA: Insufficient documentation

## 2020-04-02 HISTORY — PX: COLONOSCOPY: SHX5424

## 2020-04-02 SURGERY — COLONOSCOPY
Anesthesia: Moderate Sedation

## 2020-04-02 MED ORDER — MEPERIDINE HCL 50 MG/ML IJ SOLN
INTRAMUSCULAR | Status: DC | PRN
  Administered 2020-04-02: 50 mg via INTRAVENOUS

## 2020-04-02 MED ORDER — MIDAZOLAM HCL 5 MG/5ML IJ SOLN
INTRAMUSCULAR | Status: AC
  Filled 2020-04-02: qty 5

## 2020-04-02 MED ORDER — MIDAZOLAM HCL 5 MG/5ML IJ SOLN
INTRAMUSCULAR | Status: DC | PRN
  Administered 2020-04-02 (×2): 1 mg via INTRAVENOUS
  Administered 2020-04-02: 2 mg via INTRAVENOUS

## 2020-04-02 MED ORDER — MEPERIDINE HCL 50 MG/ML IJ SOLN
INTRAMUSCULAR | Status: AC
  Filled 2020-04-02: qty 1

## 2020-04-02 MED ORDER — SODIUM CHLORIDE 0.9 % IV SOLN: INTRAVENOUS | Status: DC

## 2020-04-02 MED ORDER — STERILE WATER FOR IRRIGATION IR SOLN
Status: DC | PRN
  Administered 2020-04-02: 1.5 mL

## 2020-04-02 NOTE — Interval H&P Note (Signed)
History and Physical Interval Note:  04/02/2020 8:24 AM  Angela Smith  has presented today for surgery, with the diagnosis of Screening.  The various methods of treatment have been discussed with the patient and family. After consideration of risks, benefits and other options for treatment, the patient has consented to  Procedure(s): COLONOSCOPY (N/A) as a surgical intervention.  The patient's history has been reviewed, patient examined, no change in status, stable for surgery.  I have reviewed the patient's chart and labs.  Questions were answered to the patient's satisfaction.     Aviva Signs

## 2020-04-02 NOTE — Discharge Instructions (Signed)
Colonoscopy, Adult, Care After  This sheet gives you information about how to care for yourself after your procedure. Your health care provider may also give you more specific instructions. If you have problems or questions, contact your health care provider.  What can I expect after the procedure?  After the procedure, it is common to have:  · A small amount of blood in your stool for 24 hours after the procedure.  · Some gas.  · Mild cramping or bloating of your abdomen.  Follow these instructions at home:  Eating and drinking    · Drink enough fluid to keep your urine pale yellow.  · Follow instructions from your health care provider about eating or drinking restrictions.  · Resume your normal diet as instructed by your health care provider. Avoid heavy or fried foods that are hard to digest.  Activity  · Rest as told by your health care provider.  · Avoid sitting for a long time without moving. Get up to take short walks every 1-2 hours. This is important to improve blood flow and breathing. Ask for help if you feel weak or unsteady.  · Return to your normal activities as told by your health care provider. Ask your health care provider what activities are safe for you.  Managing cramping and bloating    · Try walking around when you have cramps or feel bloated.  · Apply heat to your abdomen as told by your health care provider. Use the heat source that your health care provider recommends, such as a moist heat pack or a heating pad.  ? Place a towel between your skin and the heat source.  ? Leave the heat on for 20-30 minutes.  ? Remove the heat if your skin turns bright red. This is especially important if you are unable to feel pain, heat, or cold. You may have a greater risk of getting burned.  General instructions  · If you were given a sedative during the procedure, it can affect you for several hours. Do not drive or operate machinery until your health care provider says that it is safe.  · For the first  24 hours after the procedure:  ? Do not sign important documents.  ? Do not drink alcohol.  ? Do your regular daily activities at a slower pace than normal.  ? Eat soft foods that are easy to digest.  · Take over-the-counter and prescription medicines only as told by your health care provider.  · Keep all follow-up visits as told by your health care provider. This is important.  Contact a health care provider if:  · You have blood in your stool 2-3 days after the procedure.  Get help right away if you have:  · More than a small spotting of blood in your stool.  · Large blood clots in your stool.  · Swelling of your abdomen.  · Nausea or vomiting.  · A fever.  · Increasing pain in your abdomen that is not relieved with medicine.  Summary  · After the procedure, it is common to have a small amount of blood in your stool. You may also have mild cramping and bloating of your abdomen.  · If you were given a sedative during the procedure, it can affect you for several hours. Do not drive or operate machinery until your health care provider says that it is safe.  · Get help right away if you have a lot of blood in your   stool, nausea or vomiting, a fever, or increased pain in your abdomen.  This information is not intended to replace advice given to you by your health care provider. Make sure you discuss any questions you have with your health care provider.  Document Revised: 01/20/2019 Document Reviewed: 08/22/2018  Elsevier Patient Education © 2021 [employer]

## 2020-04-02 NOTE — Op Note (Signed)
[facility] Patient Name: Angela Smith Procedure Date: 04/02/2020 8:12 AM MRN: [id number] Date of Birth: 1975/03/03 Attending MD: Aviva Signs , MD CSN: [id number] Age: 45 Admit Type: Outpatient Procedure:                Colonoscopy Indications:              Screening for colorectal malignant neoplasm Providers:                Aviva Signs, MD, Gwenlyn Fudge, RN, Randa Spike,                            Technician Referring MD:              Medicines:                Midazolam 4 mg IV, Meperidine 50 mg IV Complications:            No immediate complications. Estimated Blood Loss:     Estimated blood loss: none. Procedure:                Pre-Anesthesia Assessment:                           - Prior to the procedure, a History and Physical                            was performed, and patient medications and                            allergies were reviewed. The patient is competent.                            The risks and benefits of the procedure and the                            sedation options and risks were discussed with the                            patient. All questions were answered and informed                            consent was obtained. Patient identification and                            proposed procedure were verified by the physician,                            the nurse and the technician in the endoscopy                            suite. Mental Status Examination: alert and                            oriented. Airway Examination: normal oropharyngeal  airway and neck mobility. Respiratory Examination:                            clear to auscultation. CV Examination: RRR, no                            murmurs, no S3 or S4. Prophylactic Antibiotics: The                            patient does not require prophylactic antibiotics.                            Prior Anticoagulants: The patient has taken no                             previous anticoagulant or antiplatelet agents. ASA                            Grade Assessment: I - A normal, healthy patient.                            After reviewing the risks and benefits, the patient                            was deemed in satisfactory condition to undergo the                            procedure. The anesthesia plan was to use moderate                            sedation / analgesia (conscious sedation).                            Immediately prior to administration of medications,                            the patient was re-assessed for adequacy to receive                            sedatives. The heart rate, respiratory rate, oxygen                            saturations, blood pressure, adequacy of pulmonary                            ventilation, and response to care were monitored                            throughout the procedure. The physical status of                            the patient was re-assessed after the procedure.  After obtaining informed consent, the colonoscope                            was passed under direct vision. Throughout the                            procedure, the patient's blood pressure, pulse, and                            oxygen saturations were monitored continuously. The                            [id number]-[id number] ([id number]) scope was introduced through                            the anus and advanced to the the cecum, identified                            by the appendiceal orifice, ileocecal valve and                            palpation. No anatomical landmarks were                            photographed. The entire colon was well visualized.                            The colonoscopy was somewhat difficult due to a                            tortuous colon. The patient tolerated the procedure                            well. The quality of the bowel preparation was                             adequate. The total duration of the procedure was                            25 minutes. Scope In: 8:29:36 AM Scope Out: 8:52:12 AM Scope Withdrawal Time: 0 hours 6 minutes 28 seconds  Total Procedure Duration: 0 hours 22 minutes 36 seconds  Findings:      Hemorrhoids were found on perianal exam.      The exam was otherwise without abnormality on direct and retroflexion       views. Impression:               - Hemorrhoids found on perianal exam.                           - The examination was otherwise normal on direct                            and retroflexion views.                           -  No specimens collected. Moderate Sedation:      Moderate (conscious) sedation was administered by the endoscopy nurse       and supervised by the endoscopist. The patient's oxygen saturation,       heart rate, blood pressure and response to care were monitored. Recommendation:           - Written discharge instructions were provided to                            the patient.                           - The signs and symptoms of potential delayed                            complications were discussed with the patient.                           - Patient has a contact number available for                            emergencies.                           - Return to normal activities tomorrow.                           - Resume previous diet.                           - Continue present medications.                           - Repeat colonoscopy in 10 years for screening                            purposes. Procedure Code(s):        --- Professional ---                           [phone], Colonoscopy, flexible; diagnostic, including                            collection of specimen(s) by brushing or washing,                            when performed (separate procedure) Diagnosis Code(s):        --- Professional ---                           Z12.11, Encounter for screening for malignant                             neoplasm of colon                           K64.9, Unspecified hemorrhoids CPT copyright 2019 American Medical Association. All rights  reserved. The codes documented in this report are preliminary and upon coder review may  be revised to meet current compliance requirements. Aviva Signs, MD Aviva Signs, MD 04/02/2020 8:56:29 AM This report has been signed electronically. Number of Addenda: 0

## 2020-04-05 ENCOUNTER — Encounter (HOSPITAL_COMMUNITY): Payer: Self-pay | Admitting: General Surgery

## 2020-04-21 ENCOUNTER — Encounter (HOSPITAL_COMMUNITY): Payer: Self-pay | Admitting: Emergency Medicine

## 2020-04-21 ENCOUNTER — Emergency Department (HOSPITAL_COMMUNITY): Payer: 59

## 2020-04-21 ENCOUNTER — Other Ambulatory Visit: Payer: Self-pay

## 2020-04-21 ENCOUNTER — Emergency Department (HOSPITAL_COMMUNITY)
Admission: EM | Admit: 2020-04-21 | Discharge: 2020-04-21 | Disposition: A | Discharge status: Home / Self Care | Payer: 59 | Attending: Emergency Medicine | Admitting: Emergency Medicine

## 2020-04-21 DIAGNOSIS — Z85038 Personal history of other malignant neoplasm of large intestine: Secondary | ICD-10-CM | POA: Insufficient documentation

## 2020-04-21 DIAGNOSIS — Z87891 Personal history of nicotine dependence: Secondary | ICD-10-CM | POA: Diagnosis not present

## 2020-04-21 DIAGNOSIS — K644 Residual hemorrhoidal skin tags: Secondary | ICD-10-CM | POA: Diagnosis not present

## 2020-04-21 DIAGNOSIS — K649 Unspecified hemorrhoids: Secondary | ICD-10-CM

## 2020-04-21 DIAGNOSIS — K6289 Other specified diseases of anus and rectum: Secondary | ICD-10-CM

## 2020-04-21 LAB — URINALYSIS, ROUTINE W REFLEX MICROSCOPIC
Bilirubin Urine: NEGATIVE
Glucose, UA: NEGATIVE mg/dL
Hgb urine dipstick: NEGATIVE
Ketones, ur: 20 mg/dL — AB
Leukocytes,Ua: NEGATIVE
Nitrite: NEGATIVE
Protein, ur: NEGATIVE mg/dL
Specific Gravity, Urine: 1.021 (ref 1.005–1.030)
pH: 6 (ref 5.0–8.0)

## 2020-04-21 LAB — CBC WITH DIFFERENTIAL/PLATELET
Abs Immature Granulocytes: 0.02 10*3/uL (ref 0.00–0.07)
Basophils Absolute: 0 10*3/uL (ref 0.0–0.1)
Basophils Relative: 0 %
Eosinophils Absolute: 0.1 10*3/uL (ref 0.0–0.5)
Eosinophils Relative: 1 %
HCT: 38.9 % (ref 36.0–46.0)
Hemoglobin: 12.6 g/dL (ref 12.0–15.0)
Immature Granulocytes: 0 %
Lymphocytes Relative: 21 %
Lymphs Abs: 2.1 10*3/uL (ref 0.7–4.0)
MCH: 30.4 pg (ref 26.0–34.0)
MCHC: 32.4 g/dL (ref 30.0–36.0)
MCV: 94 fL (ref 80.0–100.0)
Monocytes Absolute: 0.6 10*3/uL (ref 0.1–1.0)
Monocytes Relative: 6 %
Neutro Abs: 6.9 10*3/uL (ref 1.7–7.7)
Neutrophils Relative %: 72 %
Platelets: 395 10*3/uL (ref 150–400)
RBC: 4.14 MIL/uL (ref 3.87–5.11)
RDW: 13.7 % (ref 11.5–15.5)
WBC: 9.6 10*3/uL (ref 4.0–10.5)
nRBC: 0 % (ref 0.0–0.2)

## 2020-04-21 LAB — BASIC METABOLIC PANEL
Anion gap: 8 (ref 5–15)
BUN: 13 mg/dL (ref 6–20)
CO2: 21 mmol/L — ABNORMAL LOW (ref 22–32)
Calcium: 9.3 mg/dL (ref 8.9–10.3)
Chloride: 106 mmol/L (ref 98–111)
Creatinine, Ser: 0.69 mg/dL (ref 0.44–1.00)
GFR, Estimated: >60 mL/min (ref >60)
Glucose, Bld: 90 mg/dL (ref 70–99)
Potassium: 3.8 mmol/L (ref 3.5–5.1)
Sodium: 135 mmol/L (ref 135–145)

## 2020-04-21 LAB — POC URINE PREG, ED: Preg Test, Ur: NEGATIVE

## 2020-04-21 MED ORDER — LIDOCAINE HCL URETHRAL/MUCOSAL 2 % EX GEL
1.0000 "application " | Freq: Once | CUTANEOUS | Status: AC
  Administered 2020-04-21: 1 via TOPICAL
  Filled 2020-04-21: qty 10

## 2020-04-21 MED ORDER — IOHEXOL 300 MG/ML  SOLN
100.0000 mL | Freq: Once | INTRAMUSCULAR | Status: AC | PRN
  Administered 2020-04-21: 100 mL via INTRAVENOUS

## 2020-04-21 MED ORDER — SODIUM CHLORIDE 0.9 % IV BOLUS
500.0000 mL | Freq: Once | INTRAVENOUS | Status: AC
  Administered 2020-04-21: 500 mL via INTRAVENOUS

## 2020-04-21 MED ORDER — HYDROCORTISONE 1 % EX CREA
TOPICAL_CREAM | Freq: Once | CUTANEOUS | Status: AC
  Administered 2020-04-21: 1 via TOPICAL
  Filled 2020-04-21: qty 28

## 2020-04-21 MED ORDER — KETOROLAC TROMETHAMINE 30 MG/ML IJ SOLN
30.0000 mg | Freq: Once | INTRAMUSCULAR | Status: AC
  Administered 2020-04-21: 30 mg via INTRAVENOUS
  Filled 2020-04-21: qty 1

## 2020-04-21 NOTE — ED Provider Notes (Signed)
Care of the patient assumed at the change of shift pending CT for evaluation of rectal pain.   5:20 PM CT images reviewed and discussed with patient. No concerning findings. She reports pain resolved after having a bowel movement here in the ED. Husband at bedside states she has 'been dealing with this for 28 years' so sounds like not just a single acute episode although this pain was more severe than previous. Discussed management of hemorrhoids and diagnosis of proctalgia fugax. Recommend PCP follow up for recheck.    Truddie Hidden, MD 04/21/20 [phone]

## 2020-04-21 NOTE — ED Notes (Addendum)
Entered room and introduced self to patient. Pt appears to be resting in bed, respirations are even and unlabored with equal chest rise and fall. Bed is locked in the lowest position, side rails x2, call bell within reach. Pt educated on call light use and hourly rounding, verbalized understanding and in agreement at this time. All questions and concerns voiced addressed. Refreshments offered and provided per patient request.  

## 2020-04-21 NOTE — ED Notes (Signed)
Topical Lidocaine applied per order and dressed with a nonadherent Tefla dressing pad. Post application, this RN notes that patient appears comfortable in bed. Will continue to assess pain and status at this time.

## 2020-04-21 NOTE — ED Notes (Signed)
This RN educated by provider that patient is laying in bed with reports of 10/10 pain. Obtaining new orders at this time.

## 2020-04-21 NOTE — ED Notes (Signed)
Patient transported to CT 

## 2020-04-21 NOTE — ED Provider Notes (Signed)
[facility] EMERGENCY DEPARTMENT Provider Note   CSN: [id number] Arrival date & time: 04/21/20  1121     History Chief Complaint  Patient presents with  . Rectal Pain    Angela Smith is a 45 y.o. female.  HPI   45 year old female with past medical history of external hemorrhoids presents the emergency department with rectal pain.  Patient states she had a routine screening colonoscopy a couple weeks ago.  She states that it was the prep for the colonoscopy that flared up her hemorrhoids.  She has been doing her usual regimens however yesterday evening as she had a hard constipated bowel movements that she feels made the hemorrhoid more swollen.  When she woke up this morning is more severe, sharp.  She is used her topical regimen without any significant relief.  Denies any fever, she is still passing gas, no nausea/vomiting, no abdominal pain/distention.  Past Medical History:  Diagnosis Date  . Allergy    ALLERGIC RHINITS  . Fibrocystic breast disease   . Hyperlipidemia    DIET/ HERBAL CONTROLED  . Memory difficulty   . Miscarriage 2006   TWO SEPERATE 4 MONTHS APART  . 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 On no meds and no more seizures.    Patient Active Problem List   Diagnosis Date Noted  . Special screening for malignant neoplasms, colon   . Hemorrhoids without complication   . Physical exam 01/24/2018  . Word finding difficulty 10/21/2017  . Visual field constriction 10/21/2017  . Memory loss 10/21/2017  . Chronic cholecystitis   . Dyspepsia   . Abdominal pain 08/09/2016    Past Surgical History:  Procedure Laterality Date  . APPENDECTOMY  2018  . CHOLECYSTECTOMY N/A 06/04/2017   Procedure: LAPAROSCOPIC CHOLECYSTECTOMY;  Surgeon: Aviva Signs, MD;  Location: [facility] ORS;  Service: General;  Laterality: N/A;  . COLONOSCOPY N/A 04/02/2020   Procedure: COLONOSCOPY;  Surgeon: Aviva Signs, MD;  Location: [facility] ENDO SUITE;  Service: General;  Laterality:  N/A;  . DILATION AND CURETTAGE OF UTERUS  2006   MISCARRIAGE  . ESOPHAGOGASTRODUODENOSCOPY N/A 01/11/2017   mild gastritis, normal small bowel  . KNEE ARTHROSCOPY Left    X 2  . LAPAROSCOPIC APPENDECTOMY N/A 08/10/2016   Procedure: APPENDECTOMY LAPAROSCOPIC;  Surgeon: Aviva Signs, MD;  Location: [facility] ORS;  Service: General;  Laterality: N/A;     OB History   No obstetric history on file.     Family History  Problem Relation Age of Onset  . Depression Mother   . Hypertension Father   . Hyperlipidemia Father   . Healthy Brother   . Healthy Daughter   . Healthy Daughter   . Diabetes Maternal Grandmother   . Osteoporosis Maternal Grandmother   . Arthritis Maternal Grandmother   . Asthma Maternal Grandmother   . COPD Maternal Grandmother   . Hyperlipidemia Maternal Grandmother   . Hypertension Maternal Grandmother   . Heart disease Maternal Grandfather   . Hyperlipidemia Maternal Grandfather   . Cancer Maternal Grandfather        PANCREATIC/ BONE  . Cancer Paternal Grandfather        THROAT  . Diabetes Paternal Grandfather   . Colon cancer Neg Hx   . Colon polyps Neg Hx     Social History   Tobacco Use  . Smoking status: Former Smoker    Quit date: 01/24/1998    Years since quitting: 22.2  . Smokeless tobacco: Never Used  Vaping Use  . Vaping Use: Never used  Substance Use Topics  . Alcohol use: Not Currently  . Drug use: No    Home Medications Prior to Admission medications   Medication Sig Start Date End Date Taking? Authorizing Provider  Cholecalciferol (VITAMIN D3) 125 MCG (5000 UT) TABS Take 5,000 Units by mouth daily.    [provider]  dibucaine (NUPERCAINAL) 1 % OINT Place 1 application rectally as needed for hemorrhoids.    [provider]  Digestive Enzymes (DIGESTIVE ENZYME PO) Take 1 capsule by mouth daily as needed (dairy based foods (upset stomach)).    [provider]  ibuprofen (ADVIL) 200 MG tablet Take 400 mg by mouth  every 8 (eight) hours as needed (pain.).    [provider]  Levonorgestrel (MIRENA IU) 1 each by Intrauterine route once.     [provider]  Multiple Vitamin (MULTIVITAMIN WITH MINERALS) TABS tablet Take 1 tablet by mouth daily.    [provider]  pantoprazole (PROTONIX) 40 MG tablet TAKE 1 TABLET BY MOUTH EVERY DAY Patient taking differently: Take 40 mg by mouth daily. 10/31/19   Midge Minium, MD  polyethylene glycol (MIRALAX / GLYCOLAX) 17 g packet Take 17 g by mouth daily.    [provider]  Probiotic Product (PROBIOTIC PO) Take 1 packet by mouth daily.    [provider]  vitamin C (ASCORBIC ACID) 500 MG tablet Take 500 mg by mouth daily.    [provider]  Zinc 100 MG TABS Take 100 mg by mouth daily.    [provider]    Allergies    Dilaudid [hydromorphone] and Rhinocort [budesonide]  Review of Systems   Review of Systems  Constitutional: Negative for chills and fever.  Respiratory: Negative for shortness of breath.   Cardiovascular: Negative for chest pain.  Gastrointestinal: Positive for anal bleeding, constipation and rectal pain. Negative for abdominal distention, abdominal pain, diarrhea, nausea and vomiting.  Genitourinary: Negative for dysuria and pelvic pain.  Skin: Negative for rash.  Neurological: Negative for headaches.    Physical Exam Updated Vital Signs BP (!) 148/98 (BP Location: Left Arm)   Pulse 95   Temp 98 F (36.7 C) (Oral)   Resp 17   Ht 5\' 9"  (1.753 m)   Wt 73.5 kg   SpO2 99%   BMI 23.92 kg/m   Physical Exam Vitals and nursing note reviewed.  Constitutional:      Appearance: Normal appearance.  HENT:     Head: Normocephalic.     Mouth/Throat:     Mouth: Mucous membranes are moist.  Cardiovascular:     Rate and Rhythm: Normal rate.  Pulmonary:     Effort: Pulmonary effort is normal. No respiratory distress.  Abdominal:     Palpations: Abdomen is soft.      Tenderness: There is no abdominal tenderness.  Genitourinary:    Comments: External hemorrhoid that is approximately 2 cm x 1, swollen, doesn't appear thrombosed, no tenderness to palpation around the rectum or in the buttocks, no active bleeding, externally the vagina is normal Skin:    General: Skin is warm.  Neurological:     Mental Status: She is alert and oriented to person, place, and time. Mental status is at baseline.  Psychiatric:        Mood and Affect: Mood normal.     ED Results / Procedures / Treatments   Labs (all labs ordered are listed, but only abnormal results are  displayed) Labs Reviewed - No data to display  EKG None  Radiology No results found.  Procedures Procedures   Medications Ordered in ED Medications  hydrocortisone cream 1 % (has no administration in time range)  lidocaine (XYLOCAINE) 2 % jelly 1 application (1 application Topical Given 04/21/20 1233)    ED Course  I have reviewed the triage vital signs and the nursing notes.  Pertinent labs & imaging results that were available during my care of the patient were reviewed by me and considered in my medical decision making (see chart for details).    MDM Rules/Calculators/A&P                          45 year old female presents emergency department with worsening pain in regards to an external hemorrhoid.  On exam there is a large external hemorrhoid, swollen and macerated.  Plan to apply topical lidocaine hydrocortisone for relief.  Patient had no significant relief with topical lidocaine or hydrocortisone.  Dr. Constance Haw was in the department was kind enough to evaluate the hemorrhoid with me.  We both agree that this is not a thrombosed hemorrhoid, no indication for excision or incision.  On reevaluation the patient's husband is at bedside.  She is extremely uncomfortable.  States that the pain is still localized to the rectal area.  She does not tolerate digital exam.  She is writhing in the  fetal position, sometimes on all 4 and gripping the hand rails.  Vitals remain normal, abdomen remains benign however with this degree of discomfort we will do a CT of the abdomen pelvis to rule out any other rectal pathology.  Patient signed out pending CT.  Final Clinical Impression(s) / ED Diagnoses Final diagnoses:  None    Rx / DC Orders ED Discharge Orders    None       Lorelle Gibbs, DO 04/21/20 1443

## 2020-04-21 NOTE — ED Triage Notes (Signed)
Pt states that she has hemorrhoids and has been treating them. Pt is here for pain control.

## 2020-04-21 NOTE — ED Notes (Signed)
Third dose of Lidocaine and first dose of Hydrocortisone Cream applied to rectum per order. Pt educated that staff will return to bedside and evaluate pain control at 30 minutes post application. Pt and family verbalized understanding and in agreement at this time.

## 2020-05-05 ENCOUNTER — Other Ambulatory Visit: Payer: Self-pay | Admitting: Family Medicine

## 2020-05-05 DIAGNOSIS — R1013 Epigastric pain: Secondary | ICD-10-CM

## 2020-05-21 ENCOUNTER — Other Ambulatory Visit: Payer: Self-pay | Admitting: Family Medicine

## 2020-05-21 DIAGNOSIS — R1013 Epigastric pain: Secondary | ICD-10-CM

## 2020-08-07 ENCOUNTER — Encounter: Payer: Self-pay | Admitting: *Deleted

## 2020-10-31 ENCOUNTER — Other Ambulatory Visit: Payer: Self-pay | Admitting: Family Medicine

## 2020-10-31 DIAGNOSIS — R1013 Epigastric pain: Secondary | ICD-10-CM

## 2021-04-08 ENCOUNTER — Encounter: Payer: Self-pay | Admitting: Family Medicine

## 2021-04-14 ENCOUNTER — Encounter: Payer: Self-pay | Admitting: Family Medicine

## 2021-04-14 ENCOUNTER — Ambulatory Visit (INDEPENDENT_AMBULATORY_CARE_PROVIDER_SITE_OTHER): Payer: Self-pay | Admitting: Family Medicine

## 2021-04-14 VITALS — BP 102/68 | HR 72 | Temp 98.7°F | Resp 16 | Ht 69.0 in | Wt 176.6 lb

## 2021-04-14 DIAGNOSIS — Z0000 Encounter for general adult medical examination without abnormal findings: Secondary | ICD-10-CM

## 2021-04-14 DIAGNOSIS — K219 Gastro-esophageal reflux disease without esophagitis: Secondary | ICD-10-CM | POA: Insufficient documentation

## 2021-04-14 DIAGNOSIS — E663 Overweight: Secondary | ICD-10-CM

## 2021-04-14 DIAGNOSIS — Z136 Encounter for screening for cardiovascular disorders: Secondary | ICD-10-CM

## 2021-04-14 LAB — HEPATIC FUNCTION PANEL
ALT: 19 U/L (ref 0–35)
AST: 18 U/L (ref 0–37)
Albumin: 4.3 g/dL (ref 3.5–5.2)
Alkaline Phosphatase: 59 U/L (ref 39–117)
Bilirubin, Direct: 0.1 mg/dL (ref 0.0–0.3)
Total Bilirubin: 0.5 mg/dL (ref 0.2–1.2)
Total Protein: 6.7 g/dL (ref 6.0–8.3)

## 2021-04-14 LAB — CBC WITH DIFFERENTIAL/PLATELET
Basophils Absolute: 0 10*3/uL (ref 0.0–0.1)
Basophils Relative: 0.6 % (ref 0.0–3.0)
Eosinophils Absolute: 0 10*3/uL (ref 0.0–0.7)
Eosinophils Relative: 0.7 % (ref 0.0–5.0)
HCT: 39.3 % (ref 36.0–46.0)
Hemoglobin: 13 g/dL (ref 12.0–15.0)
Lymphocytes Relative: 33.1 % (ref 12.0–46.0)
Lymphs Abs: 1.9 10*3/uL (ref 0.7–4.0)
MCHC: 33.1 g/dL (ref 30.0–36.0)
MCV: 93 fl (ref 78.0–100.0)
Monocytes Absolute: 0.4 10*3/uL (ref 0.1–1.0)
Monocytes Relative: 6.5 % (ref 3.0–12.0)
Neutro Abs: 3.4 10*3/uL (ref 1.4–7.7)
Neutrophils Relative %: 59.1 % (ref 43.0–77.0)
Platelets: 364 10*3/uL (ref 150.0–400.0)
RBC: 4.23 Mil/uL (ref 3.87–5.11)
RDW: 14.3 % (ref 11.5–15.5)
WBC: 5.7 10*3/uL (ref 4.0–10.5)

## 2021-04-14 LAB — BASIC METABOLIC PANEL
BUN: 8 mg/dL (ref 6–23)
CO2: 27 mEq/L (ref 19–32)
Calcium: 9.4 mg/dL (ref 8.4–10.5)
Chloride: 102 mEq/L (ref 96–112)
Creatinine, Ser: 0.76 mg/dL (ref 0.40–1.20)
GFR: 94.07 mL/min (ref >60.00)
Glucose, Bld: 78 mg/dL (ref 70–99)
Potassium: 3.9 mEq/L (ref 3.5–5.1)
Sodium: 137 mEq/L (ref 135–145)

## 2021-04-14 LAB — TSH: TSH: 1.52 u[IU]/mL (ref 0.35–5.50)

## 2021-04-14 LAB — LIPID PANEL
Cholesterol: 169 mg/dL (ref 0–200)
HDL: 66.3 mg/dL (ref >39.00)
LDL Cholesterol: 91 mg/dL (ref 0–99)
NonHDL: 103.16
Total CHOL/HDL Ratio: 3
Triglycerides: 63 mg/dL (ref 0.0–149.0)
VLDL: 12.6 mg/dL (ref 0.0–40.0)

## 2021-04-14 MED ORDER — OMEPRAZOLE 40 MG PO CPDR
40.0000 mg | DELAYED_RELEASE_CAPSULE | Freq: Every day | ORAL | 1 refills | Status: DC
Start: 2021-04-14 — End: 2021-10-02

## 2021-04-14 MED ORDER — DIBUCAINE (PERIANAL) 1 % EX OINT: 1.0000 "application " | TOPICAL_OINTMENT | CUTANEOUS | 1 refills | Status: DC | PRN

## 2021-04-14 NOTE — Assessment & Plan Note (Signed)
Deteriorated.  Pt reports constant breakthrough sxs despite daily Protonix.  Will switch to Omeprazole '40mg'$  and increase to BID dosing if no relief in 1-2 weeks.  Pt expressed understanding and is in agreement w/ plan.  ?

## 2021-04-14 NOTE — Patient Instructions (Signed)
Follow up in 1 year or as needed ?We'll notify you of your lab results and make any changes if needed ?STOP the Pantoprazole  ?START the Omeprazole once daily.  If no improvement in symptoms in the next 1-2 weeks, increase to twice daily ?Continue to work on healthy diet and regular exercise- you can do it! ?Call your GYN and schedule your pap and mammo ?Call with any questions or concerns ?Happy Spring!!! ?

## 2021-04-14 NOTE — Progress Notes (Signed)
? ?  Subjective:  ? ? Patient ID: Angela Smith, female    DOB: April 18, 1975, 46 y.o.   MRN: [id number] ? ?HPI ?CPE- UTD on Tdap, colonoscopy.  Due for pap and mammo- plans to schedule w/ GYN ? ?Patient Care Team  ?  Relationship Specialty Notifications Start End  ?Midge Minium, MD PCP - General Family Medicine  01/24/18   ?Everlene Farrier, MD Consulting Physician Obstetrics and Gynecology  12/01/13   ?Wilburt Finlay, OD Referring Physician Optometry  12/01/13   ?Martin[place], Amy, MD Consulting Physician Dermatology  12/01/13   ?Danie Binder, MD (Inactive) Consulting Physician Gastroenterology  05/12/17   ?  ?Health Maintenance  ?Topic Date Due  ? HIV Screening  Never done  ? Hepatitis C Screening  Never done  ? PAP SMEAR-Modifier  04/24/2020  ? INFLUENZA VACCINE  05/09/2021 (Originally 09/09/2020)  ? TETANUS/TDAP  11/29/2022  ? COLONOSCOPY (Pts 45-58yr Insurance coverage will need to be confirmed)  04/02/2030  ? HPV VACCINES  Aged Out  ? COVID-19 Vaccine  Discontinued  ?  ? ? ?Review of Systems ?Patient reports no vision/ hearing changes, adenopathy,fever, weight change,  persistant/recurrent hoarseness , swallowing issues, chest pain, palpitations, edema, persistant/recurrent cough, hemoptysis, dyspnea (rest/exertional/paroxysmal nocturnal), gastrointestinal bleeding (melena, rectal bleeding), abdominal pain, bowel changes, GU symptoms (dysuria, hematuria, incontinence), Gyn symptoms (abnormal  bleeding, pain),  syncope, focal weakness, memory loss, numbness & tingling, skin/hair/nail changes, abnormal bruising, anxiety, or depression.  ? ?+ GERD- reports sxs returned gradually but over the last few weeks is having sxs every time she eats despite taking Protonix '40mg'$  daily.   ?+ hemorrhoidal bleeding- GI aware and not concerned ? ?This visit occurred during the SARS-CoV-2 public health emergency.  Safety protocols were in place, including screening questions prior to the visit, additional usage of staff PPE, and  extensive cleaning of exam room while observing appropriate contact time as indicated for disinfecting solutions.   ?   ?Objective:  ? Physical Exam ?General Appearance:    Alert, cooperative, no distress, appears stated age  ?Head:    Normocephalic, without obvious abnormality, atraumatic  ?Eyes:    PERRL, conjunctiva/corneas clear, EOM's intact, fundi  ?  benign, both eyes  ?Ears:    Normal TM's and external ear canals, both ears  ?Nose:   Deferred due to COVID  ?Throat:   ?Neck:   Supple, symmetrical, trachea midline, no adenopathy;  ?  Thyroid: no enlargement/tenderness/nodules  ?Back:     Symmetric, no curvature, ROM normal, no CVA tenderness  ?Lungs:     Clear to auscultation bilaterally, respirations unlabored  ?Chest Wall:    No tenderness or deformity  ? Heart:    Regular rate and rhythm, S1 and S2 normal, no murmur, rub ?  or gallop  ?Breast Exam:    Deferred to GYN  ?Abdomen:     Soft, non-tender, bowel sounds active all four quadrants,  ?  no masses, no organomegaly  ?Genitalia:    Deferred to GYN  ?Rectal:    ?Extremities:   Extremities normal, atraumatic, no cyanosis or edema  ?Pulses:   2+ and symmetric all extremities  ?Skin:   Skin color, texture, turgor normal, known atypical nevus on back  ?Lymph nodes:   Cervical, supraclavicular, and axillary nodes normal  ?Neurologic:   CNII-XII intact, normal strength, sensation and reflexes  ?  throughout  ?  ? ? ? ?   ?Assessment & Plan:  ? ? ?

## 2021-04-14 NOTE — Assessment & Plan Note (Signed)
Pt's PE WNL.  UTD on Tdap and colonoscopy.  Due for pap and mammo- pt to schedule.  Check labs.  Anticipatory guidance provided.  ?

## 2021-07-29 IMAGING — CT CT ABD-PELV W/ CM
2 of 5 series · 16 of 46 positions shown, 18 images · IV contrast (Omnipaque or Isovue)
Comparison: November 19, 2016

CLINICAL DATA: Rectal pain and nausea.

EXAM:
CT ABDOMEN AND PELVIS WITH CONTRAST
TECHNIQUE: Multidetector CT imaging of the abdomen and pelvis was performed
using the standard protocol following bolus administration of
intravenous contrast.
CONTRAST:  100mL OMNIPAQUE IOHEXOL 300 MG/ML  SOLN

[Series 2: axial st · axial · 0.80mm/px · z∈[+577,+1002]mm · 13 of 96 slices shown, 15 images]
[im 6/96  soft-tissue]
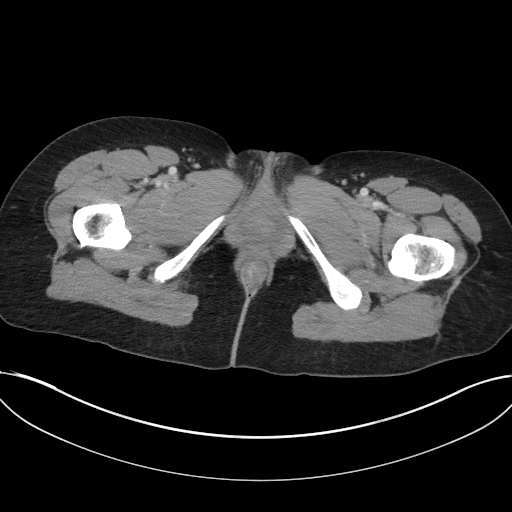
[im 6/96  bone]
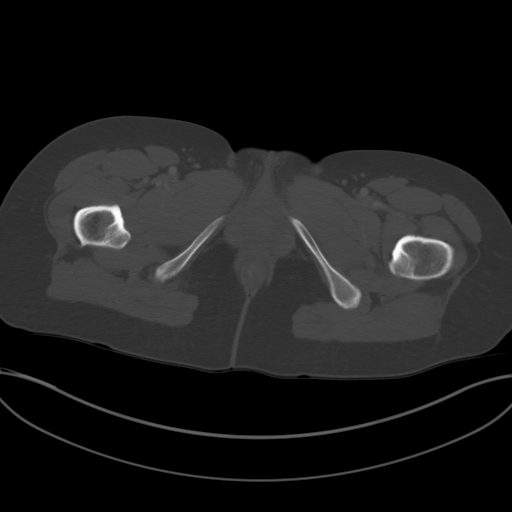
[im 16/96  soft-tissue]
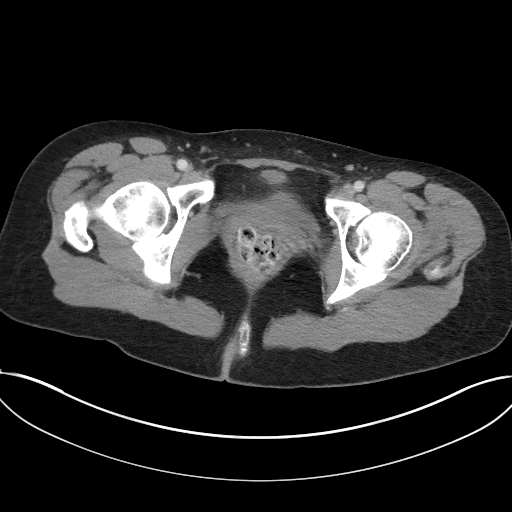
[im 21/96  soft-tissue]
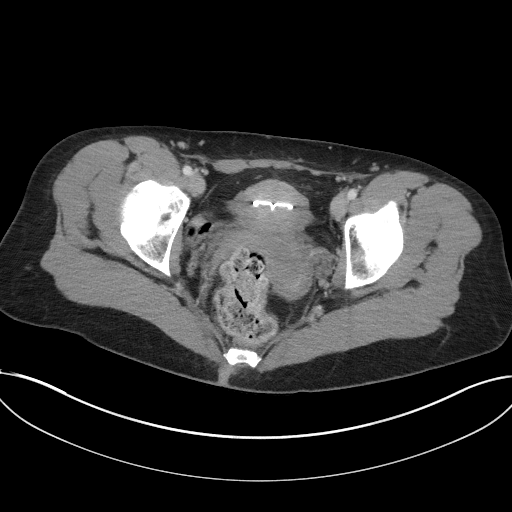
[im 26/96  soft-tissue]
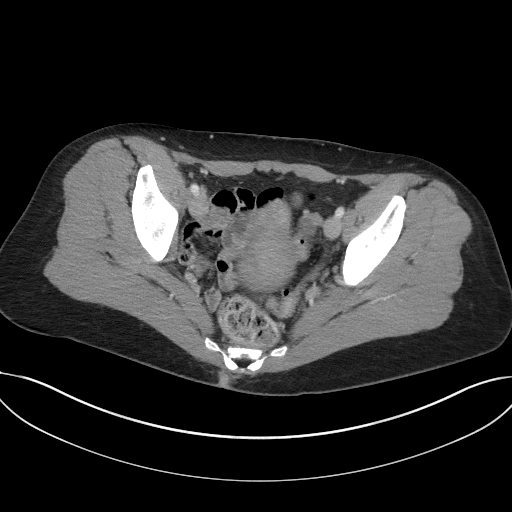
[im 36/96  soft-tissue]
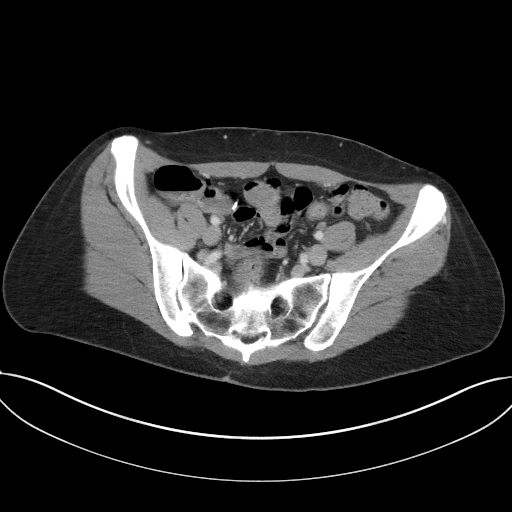
[im 41/96  soft-tissue]
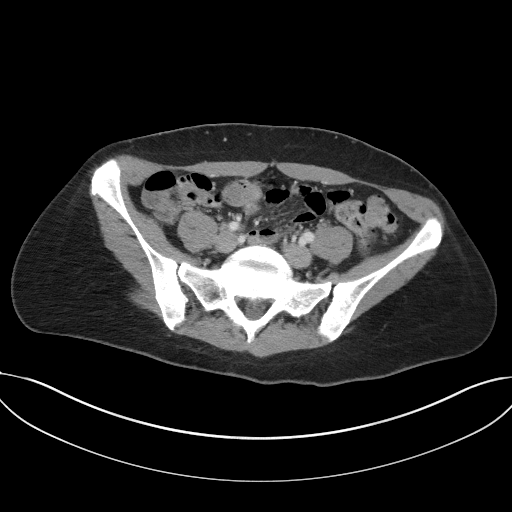
[im 51/96  soft-tissue]
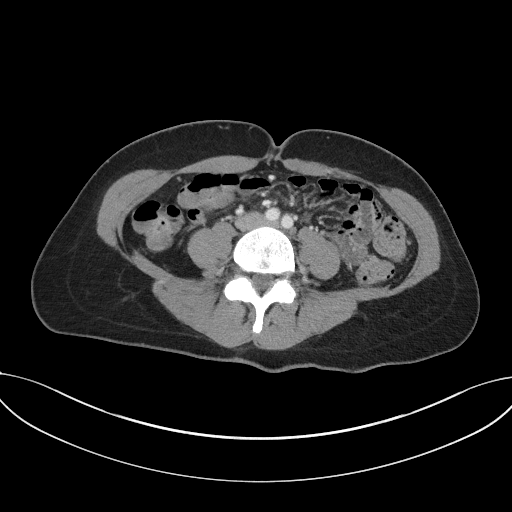
[im 56/96  soft-tissue]
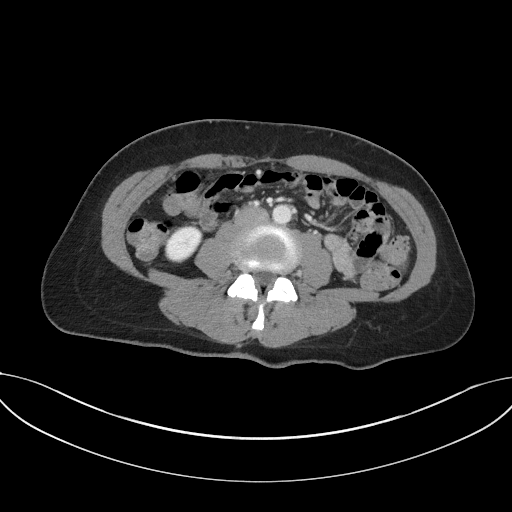
[im 61/96  soft-tissue]
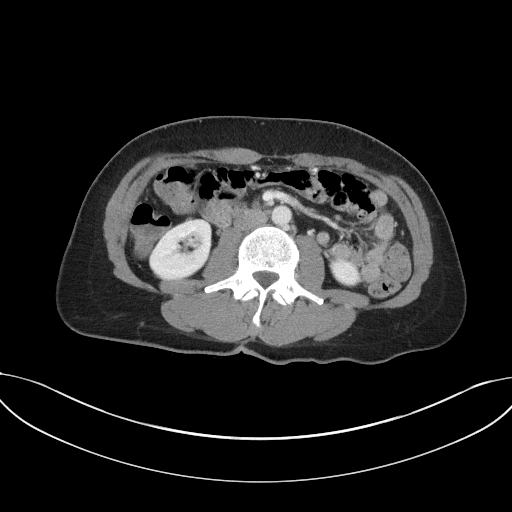
[im 61/96  bone]
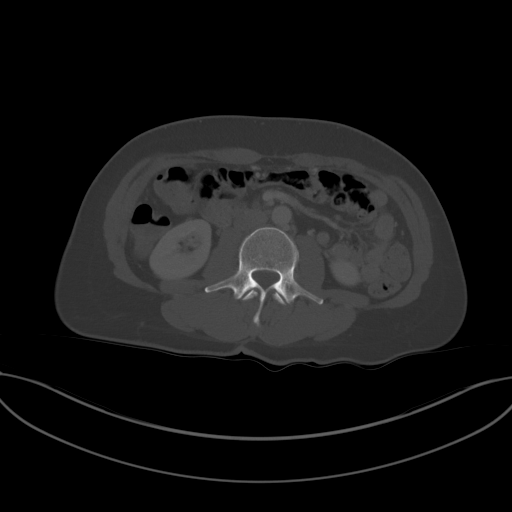
[im 71/96  soft-tissue]
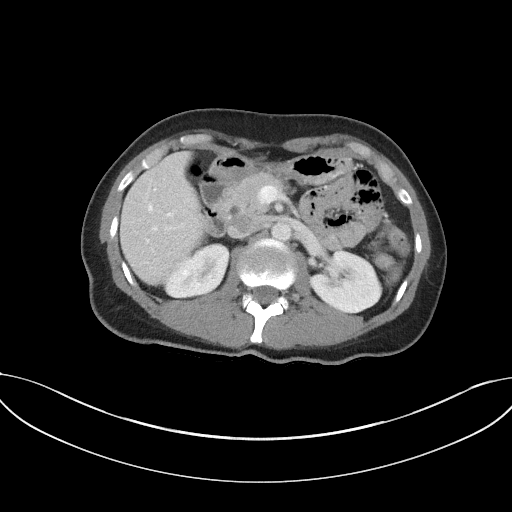
[im 76/96  soft-tissue]
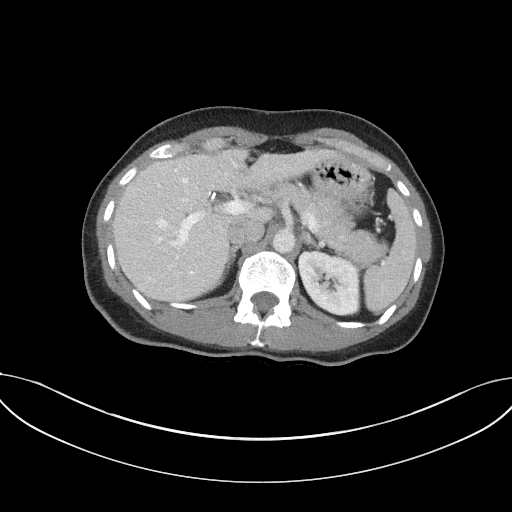
[im 81/96  soft-tissue]
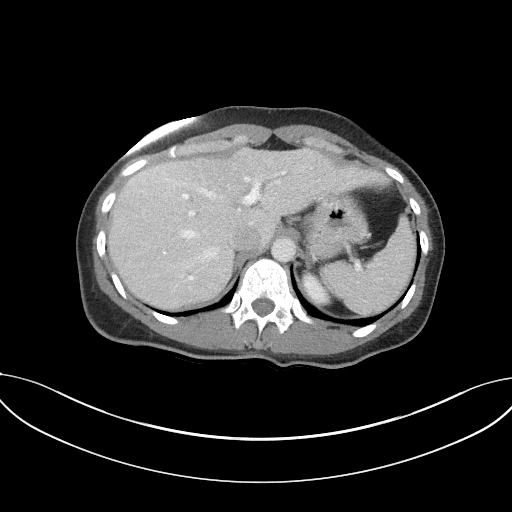
[im 91/96  soft-tissue]
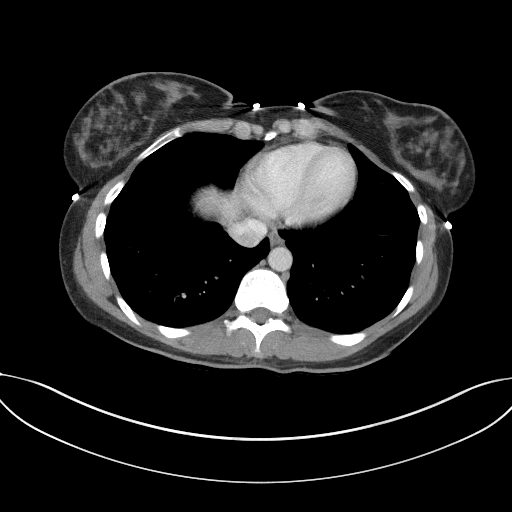

[Series 5: coronal st · coronal · 0.75mm/px · 3 of 87 slices shown]
[im 29/87  soft-tissue]
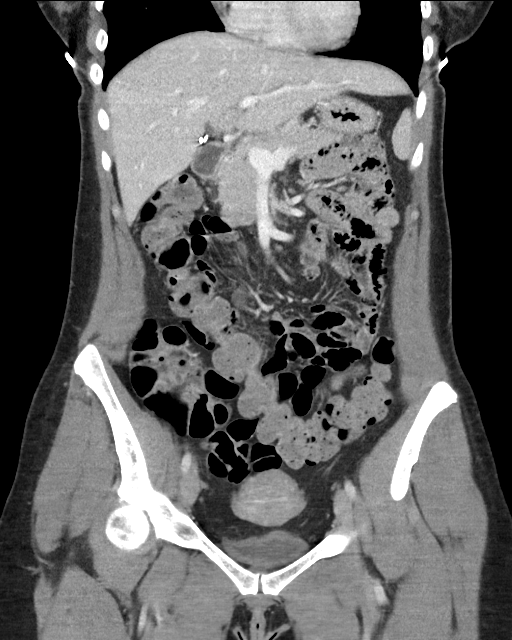
[im 39/87  soft-tissue]
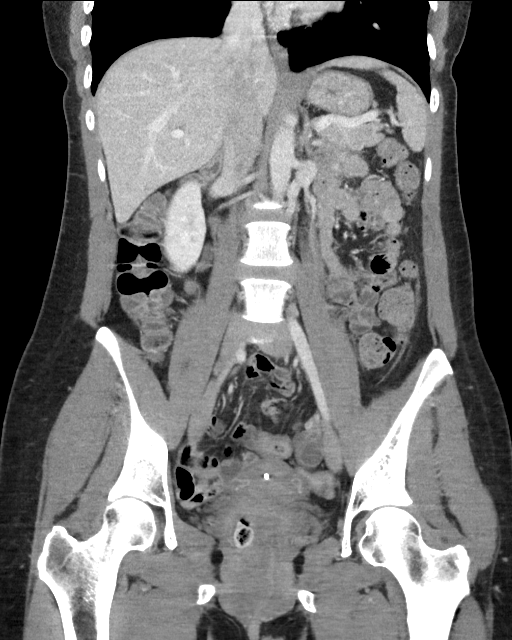
[im 48/87  soft-tissue]
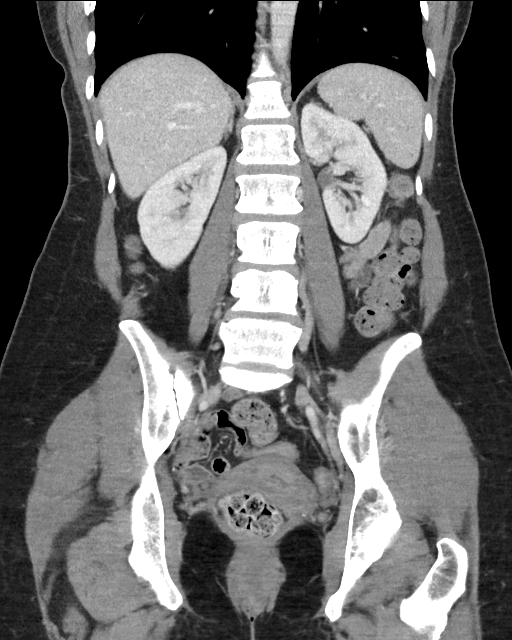

[16 of 46 positions shown; findings below may reference images not displayed]

FINDINGS: Lower chest: No acute abnormality.

Hepatobiliary: A stable 6 mm focus of parenchymal low attenuation is
seen within the left lobe of the liver. Status post cholecystectomy.
No biliary dilatation.

Pancreas: Unremarkable. No pancreatic ductal dilatation or
surrounding inflammatory changes.

Spleen: Normal in size without focal abnormality.

Adrenals/Urinary Tract: Adrenal glands are unremarkable. Kidneys are
normal, without renal calculi, focal lesion, or hydronephrosis. The
urinary bladder is empty and subsequently limited in evaluation.

Stomach/Bowel: Stomach is within normal limits. The appendix is
surgically absent. No evidence of bowel wall thickening, distention,
or inflammatory changes.

Vascular/Lymphatic: No significant vascular findings are present. No
enlarged abdominal or pelvic lymph nodes.

Reproductive: An IUD is in place. A stable 1.0 cm diameter
hyperdense focus is noted within the lower uterine segment on the
left. A 1.7 cm cyst is seen within the left adnexa.

Other: No abdominal wall hernia or abnormality. No abdominopelvic
ascites.

Musculoskeletal: No acute or significant osseous findings.
IMPRESSION: 1.   Evidence of prior cholecystectomy and appendectomy.
2.   Small left adnexal cyst, likely ovarian in origin.
3. Stable hyperdense uterine lesion which may represent a uterine
fibroid.

## 2021-10-02 ENCOUNTER — Other Ambulatory Visit: Payer: Self-pay | Admitting: Family Medicine

## 2021-10-30 ENCOUNTER — Encounter: Payer: Self-pay | Admitting: Family Medicine

## 2021-10-31 NOTE — Telephone Encounter (Signed)
I do not have these forms either

## 2021-11-03 NOTE — Telephone Encounter (Signed)
Sent pt a my chart message on Friday 10/31/21 stating I have seen  these forms  nor had them and I have left her a VM stating we still have not seen or received these forms as of today as well. I ask her to call the Chiropractor office to have them refax the forms .

## 2021-11-03 NOTE — Telephone Encounter (Signed)
Can we call patient and follow up on this unless someone has contacted her otherwise.   Thanks

## 2021-11-05 ENCOUNTER — Other Ambulatory Visit: Payer: Self-pay | Admitting: Family Medicine

## 2022-01-29 ENCOUNTER — Ambulatory Visit (INDEPENDENT_AMBULATORY_CARE_PROVIDER_SITE_OTHER): Payer: Self-pay | Admitting: Family Medicine

## 2022-01-29 ENCOUNTER — Encounter: Payer: Self-pay | Admitting: Family Medicine

## 2022-01-29 VITALS — BP 128/82 | HR 70 | Temp 98.3°F | Resp 17 | Ht 69.0 in | Wt 192.0 lb

## 2022-01-29 DIAGNOSIS — J4520 Mild intermittent asthma, uncomplicated: Secondary | ICD-10-CM

## 2022-01-29 MED ORDER — DIBUCAINE (PERIANAL) 1 % EX OINT: 1.0000 | TOPICAL_OINTMENT | CUTANEOUS | 1 refills | Status: DC | PRN

## 2022-01-29 MED ORDER — ALBUTEROL SULFATE HFA 108 (90 BASE) MCG/ACT IN AERS: 2.0000 | INHALATION_SPRAY | Freq: Four times a day (QID) | RESPIRATORY_TRACT | 3 refills | Status: DC | PRN

## 2022-01-29 NOTE — Patient Instructions (Signed)
Follow up as needed or as scheduled USE the inhaler- 2 puffs- prior to intercourse or exercise If this improves the situation, great!  No further workup needed If symptoms don't improve, please let me know so we can arrange pulmonary function testing Try and get regular physical activity to build lung capacity Call with any questions or concerns Happy Holidays!

## 2022-01-29 NOTE — Addendum Note (Signed)
Addended by: Midge Minium on: 01/29/2022 10:30 AM   Modules accepted: Orders

## 2022-01-29 NOTE — Progress Notes (Signed)
   Subjective:    Patient ID: Angela Smith, female    DOB: [birth year], 46 y.o.   MRN: [id number]  HPI 'it feels like I'm having asthma attacks'- only occurs during intercourse at the time of orgasm.  Sxs started recently and seem to be worsening.  Pt reports she will be gasping for air.  No hx of asthma.  Pt doesn't feel that sex is particularly active- doesn't feel like she's over exerting herself.  Pt doesn't identify any other triggers.  When she used to exercise she would have to do it in short bursts due to the feeling of someone 'sitting on my chest'.  Has never used an inhaler   Review of Systems For ROS see HPI     Objective:   Physical Exam Vitals reviewed.  Constitutional:      General: She is not in acute distress.    Appearance: Normal appearance. She is not ill-appearing.  HENT:     Head: Normocephalic and atraumatic.  Cardiovascular:     Rate and Rhythm: Normal rate and regular rhythm.  Pulmonary:     Effort: Pulmonary effort is normal. No respiratory distress.     Breath sounds: Normal breath sounds. No wheezing, rhonchi or rales.  Skin:    General: Skin is warm and dry.  Neurological:     General: No focal deficit present.     Mental Status: She is alert and oriented to person, place, and time.  Psychiatric:        Mood and Affect: Mood normal.        Behavior: Behavior normal.        Thought Content: Thought content normal.           Assessment & Plan:   RAD- new.  Pt reports she has never had a good lung capacity and recently has been fairly inactive.  The past 5-6 times she has had intercourse she finds herself short of breath and gasping for air.  Suspect this is similar to exercise induced bronchospasm and will do trial of albuterol inhaler.  If no improvement w/ albuterol prior to intercourse will refer to Pulmonary for a complete evaluation.  Pt expressed understanding and is in agreement w/ plan.

## 2022-04-07 ENCOUNTER — Encounter: Payer: Self-pay | Admitting: Family Medicine

## 2022-04-17 ENCOUNTER — Other Ambulatory Visit: Payer: Self-pay | Admitting: Family Medicine

## 2022-04-17 ENCOUNTER — Encounter: Payer: Self-pay | Admitting: Family Medicine

## 2022-04-21 ENCOUNTER — Encounter: Payer: Self-pay | Admitting: Family Medicine

## 2022-04-21 ENCOUNTER — Other Ambulatory Visit: Payer: Self-pay | Admitting: Family Medicine

## 2022-05-29 ENCOUNTER — Ambulatory Visit (INDEPENDENT_AMBULATORY_CARE_PROVIDER_SITE_OTHER): Payer: Self-pay | Admitting: Family Medicine

## 2022-05-29 ENCOUNTER — Encounter: Payer: Self-pay | Admitting: Family Medicine

## 2022-05-29 VITALS — BP 118/60 | HR 65 | Temp 98.1°F | Ht 67.75 in | Wt 183.6 lb

## 2022-05-29 DIAGNOSIS — E663 Overweight: Secondary | ICD-10-CM

## 2022-05-29 DIAGNOSIS — Z0000 Encounter for general adult medical examination without abnormal findings: Secondary | ICD-10-CM

## 2022-05-29 LAB — BASIC METABOLIC PANEL
BUN: 11 mg/dL (ref 6–23)
CO2: 27 mEq/L (ref 19–32)
Calcium: 9.8 mg/dL (ref 8.4–10.5)
Chloride: 103 mEq/L (ref 96–112)
Creatinine, Ser: 0.82 mg/dL (ref 0.40–1.20)
GFR: 85.2 mL/min (ref >60.00)
Glucose, Bld: 86 mg/dL (ref 70–99)
Potassium: 4.3 mEq/L (ref 3.5–5.1)
Sodium: 137 mEq/L (ref 135–145)

## 2022-05-29 LAB — CBC WITH DIFFERENTIAL/PLATELET
Basophils Absolute: 0 10*3/uL (ref 0.0–0.1)
Basophils Relative: 0.8 % (ref 0.0–3.0)
Eosinophils Absolute: 0 10*3/uL (ref 0.0–0.7)
Eosinophils Relative: 0.4 % (ref 0.0–5.0)
HCT: 40.2 % (ref 36.0–46.0)
Hemoglobin: 13.7 g/dL (ref 12.0–15.0)
Lymphocytes Relative: 34.8 % (ref 12.0–46.0)
Lymphs Abs: 2 10*3/uL (ref 0.7–4.0)
MCHC: 34.1 g/dL (ref 30.0–36.0)
MCV: 92 fl (ref 78.0–100.0)
Monocytes Absolute: 0.4 10*3/uL (ref 0.1–1.0)
Monocytes Relative: 7.1 % (ref 3.0–12.0)
Neutro Abs: 3.3 10*3/uL (ref 1.4–7.7)
Neutrophils Relative %: 56.9 % (ref 43.0–77.0)
Platelets: 415 10*3/uL — ABNORMAL HIGH (ref 150.0–400.0)
RBC: 4.37 Mil/uL (ref 3.87–5.11)
RDW: 14.9 % (ref 11.5–15.5)
WBC: 5.7 10*3/uL (ref 4.0–10.5)

## 2022-05-29 LAB — LIPID PANEL
Cholesterol: 162 mg/dL (ref 0–200)
HDL: 51.2 mg/dL (ref >39.00)
LDL Cholesterol: 97 mg/dL (ref 0–99)
NonHDL: 110.94
Total CHOL/HDL Ratio: 3
Triglycerides: 68 mg/dL (ref 0.0–149.0)
VLDL: 13.6 mg/dL (ref 0.0–40.0)

## 2022-05-29 LAB — HEPATIC FUNCTION PANEL
ALT: 16 U/L (ref 0–35)
AST: 20 U/L (ref 0–37)
Albumin: 4.5 g/dL (ref 3.5–5.2)
Alkaline Phosphatase: 82 U/L (ref 39–117)
Bilirubin, Direct: 0.1 mg/dL (ref 0.0–0.3)
Total Bilirubin: 0.4 mg/dL (ref 0.2–1.2)
Total Protein: 7.8 g/dL (ref 6.0–8.3)

## 2022-05-29 LAB — TSH: TSH: 2.28 u[IU]/mL (ref 0.35–5.50)

## 2022-05-29 MED ORDER — QVAR REDIHALER 40 MCG/ACT IN AERB: 2.0000 | INHALATION_SPRAY | Freq: Two times a day (BID) | RESPIRATORY_TRACT | 3 refills | Status: DC

## 2022-05-29 NOTE — Progress Notes (Signed)
   Subjective:    Patient ID: Angela Smith, female    DOB: [birth year], 47 y.o.   MRN: [id number]  HPI CPE- UTD on Tdap, colonoscopy.  Due for pap, mammo- pt plans to schedule when able  Patient Care Team    Relationship Specialty Notifications Start End  Sheliah Hatch, MD PCP - General Family Medicine  01/24/18   Harold Hedge, MD Consulting Physician Obstetrics and Gynecology  12/01/13   Corbin Ade, OD Referring Physician Optometry  12/01/13   [place], Amy, MD Consulting Physician Dermatology  12/01/13   [place], MD (Inactive) Consulting Physician Gastroenterology  05/12/17     Health Maintenance  Topic Date Due   PAP SMEAR-Modifier  04/24/2020   INFLUENZA VACCINE  09/10/2022   DTaP/Tdap/Td (2 - Td or Tdap) 11/29/2022   COLONOSCOPY (Pts 45-44yrs Insurance coverage will need to be confirmed)  04/02/2030   HPV VACCINES  Aged Out   COVID-19 Vaccine  Discontinued   Hepatitis C Screening  Discontinued   HIV Screening  Discontinued      Review of Systems Patient reports no vision/ hearing changes, adenopathy,fever, persistant/recurrent hoarseness , swallowing issues, chest pain, palpitations, edema, persistant/recurrent cough, hemoptysis, gastrointestinal bleeding (melena, rectal bleeding), abdominal pain, significant heartburn, bowel changes, GU symptoms (dysuria, hematuria, incontinence), Gyn symptoms (abnormal  bleeding, pain),  syncope, focal weakness, memory loss, numbness & tingling, skin/hair/nail changes, abnormal bruising or bleeding, anxiety, or depression.   +10 lb weight loss + increased asthma sxs- pt notes more frequent episodes w/ varying triggers but particularly exertion    Objective:   Physical Exam General Appearance:    Alert, cooperative, no distress, appears stated age  Head:    Normocephalic, without obvious abnormality, atraumatic  Eyes:    PERRL, conjunctiva/corneas clear, EOM's intact both eyes  Ears:    Normal TM's and external ear  canals, both ears  Nose:   Nares normal, septum midline, mucosa normal, no drainage    or sinus tenderness  Throat:   Lips, mucosa, and tongue normal; teeth and gums normal  Neck:   Supple, symmetrical, trachea midline, no adenopathy;    Thyroid: no enlargement/tenderness/nodules  Back:     Symmetric, no curvature, ROM normal, no CVA tenderness  Lungs:     Clear to auscultation bilaterally, respirations unlabored  Chest Wall:    No tenderness or deformity   Heart:    Regular rate and rhythm, S1 and S2 normal, no murmur, rub   or gallop  Breast Exam:    Deferred to GYN  Abdomen:     Soft, non-tender, bowel sounds active all four quadrants,    no masses, no organomegaly  Genitalia:    Deferred to GYN  Rectal:    Extremities:   Extremities normal, atraumatic, no cyanosis or edema  Pulses:   2+ and symmetric all extremities  Skin:   Skin color, texture, turgor normal, no rashes or lesions  Lymph nodes:   Cervical, supraclavicular, and axillary nodes normal  Neurologic:   CNII-XII intact, normal strength, sensation and reflexes    throughout          Assessment & Plan:

## 2022-05-29 NOTE — Assessment & Plan Note (Signed)
Pt's PE WNL.  UTD on Tdap, colonoscopy.  Plans to schedule pap and mammo.  Check labs.  Anticipatory guidance provided.

## 2022-05-29 NOTE — Patient Instructions (Addendum)
Follow up in 1 year or as needed We'll notify you of your lab results and make any changes if needed Call and schedule your pap and mammo at your convenience- have them send me a copy of the report Keep up the good work on healthy diet and regular exercise- you're doing great!!! START the Qvar twice daily to keep asthma symptoms at bay Use the Albuterol as needed Call with any questions or concerns Stay Safe!  Stay Healthy! Happy Spring!!!

## 2022-06-01 ENCOUNTER — Telehealth: Payer: Self-pay

## 2022-06-01 NOTE — Telephone Encounter (Signed)
-----   Message from Katherine E Tabori, MD sent at 06/01/2022  7:18 AM EDT ----- Labs look great!  No changes at this time 

## 2022-06-01 NOTE — Telephone Encounter (Signed)
Pt seen results Via my chart  

## 2022-06-01 NOTE — Telephone Encounter (Signed)
-----   Message from Sheliah Hatch, MD sent at 06/01/2022  7:18 AM EDT ----- Labs look great!  No changes at this time

## 2022-06-03 MED ORDER — FLUTICASONE PROPIONATE HFA 44 MCG/ACT IN AERO: 2.0000 | INHALATION_SPRAY | Freq: Two times a day (BID) | RESPIRATORY_TRACT | 12 refills | Status: DC

## 2022-06-03 NOTE — Telephone Encounter (Signed)
Pt is looking for alternative to Qvar due to cost, please advise

## 2022-06-15 ENCOUNTER — Encounter: Payer: Self-pay | Admitting: Family Medicine

## 2022-06-15 DIAGNOSIS — J4520 Mild intermittent asthma, uncomplicated: Secondary | ICD-10-CM

## 2022-06-29 ENCOUNTER — Encounter: Payer: Self-pay | Admitting: Pulmonary Disease

## 2022-06-29 ENCOUNTER — Ambulatory Visit (INDEPENDENT_AMBULATORY_CARE_PROVIDER_SITE_OTHER): Payer: Self-pay | Admitting: Pulmonary Disease

## 2022-06-29 VITALS — BP 112/80 | HR 76 | Ht 68.0 in | Wt 188.6 lb

## 2022-06-29 DIAGNOSIS — R0602 Shortness of breath: Secondary | ICD-10-CM

## 2022-06-29 NOTE — Progress Notes (Signed)
Angela Smith    [id number]    01-25-1976  Primary Care Physician:Tabori, Helane Rima, MD  Referring Physician: Sheliah Hatch, MD [address]  [place] [address]  Chief complaint:   Patient being seen for shortness of breath shortness of breath with wheezing  HPI:  Shortness of breath with wheezing worsening over the last few months  Albuterol did help but did give her some discomfort so cut down to just using 1  Started on Flovent, which seemed to help a little bit but still gets short of breath and wheezy whenever she is doing any activity  No history of asthma  Smoked cigarettes over 20 years ago  Does not have any history of lung disease known to  She has a pet dog, not sensitive to animal dander  Played sports when she was younger but states she never could run significantly but was able to play sport  Denies any chest pains or chest discomfort  Dad did have asthma  No pertinent occupational history  Outpatient Encounter Medications as of 06/29/2022  Medication Sig   albuterol (VENTOLIN HFA) 108 (90 Base) MCG/ACT inhaler Inhale 2 puffs into the lungs every 6 (six) hours as needed for wheezing or shortness of breath.   Cholecalciferol (VITAMIN D3) 125 MCG (5000 UT) TABS Take 5,000 Units by mouth daily.   dibucaine (NUPERCAINAL) 1 % OINT place 1 application RECTALLY AS NEEDED FOR hemorrhoids   Digestive Enzymes (DIGESTIVE ENZYME PO) Take 1 capsule by mouth daily as needed (dairy based foods (upset stomach)).   fluticasone (FLOVENT HFA) 44 MCG/ACT inhaler Inhale 2 puffs into the lungs 2 (two) times daily.   Levonorgestrel (MIRENA IU) 1 each by Intrauterine route once.    Multiple Vitamin (MULTIVITAMIN WITH MINERALS) TABS tablet Take 1 tablet by mouth daily.   omeprazole (PRILOSEC) 40 MG capsule TAKE ONE CAPSULE BY MOUTH EVERY DAY   polyethylene glycol (MIRALAX / GLYCOLAX) 17 g packet Take 17 g by mouth daily.   Probiotic Product  (PROBIOTIC PO) Take 1 tablet by mouth daily.   [DISCONTINUED] ibuprofen (ADVIL) 200 MG tablet Take 400 mg by mouth every 8 (eight) hours as needed (pain.).   No facility-administered encounter medications on file as of 06/29/2022.    Allergies as of 06/29/2022 - Review Complete 06/29/2022  Allergen Reaction Noted   Dilaudid [hydromorphone] Other (See Comments) 08/10/2016   Rhinocort [budesonide] Palpitations and Other (See Comments) 11/29/2012    Past Medical History:  Diagnosis Date   Allergy    ALLERGIC RHINITS   Fibrocystic breast disease    Hyperlipidemia    DIET/ HERBAL CONTROLED   Memory difficulty    Miscarriage 2006   TWO SEPERATE 4 MONTHS APART   Seizures (HCC)    INFANTILE; none since infancy. On no meds and no more seizures.    Past Surgical History:  Procedure Laterality Date   APPENDECTOMY  2018   CHOLECYSTECTOMY N/A 06/04/2017   Procedure: LAPAROSCOPIC CHOLECYSTECTOMY;  Surgeon: Franky Macho, MD;  Location: [facility];  Service: General;  Laterality: N/A;   COLONOSCOPY N/A 04/02/2020   Procedure: COLONOSCOPY;  Surgeon: Franky Macho, MD;  Location: [facility];  Service: General;  Laterality: N/A;   DILATION AND CURETTAGE OF UTERUS  2006   MISCARRIAGE   ESOPHAGOGASTRODUODENOSCOPY N/A 01/11/2017   mild gastritis, normal small bowel   KNEE ARTHROSCOPY Left    X 2   LAPAROSCOPIC APPENDECTOMY N/A 08/10/2016  Procedure: APPENDECTOMY LAPAROSCOPIC;  Surgeon: Franky Macho, MD;  Location: [facility];  Service: General;  Laterality: N/A;    Family History  Problem Relation Age of Onset   Depression Mother    Hypertension Father    Hyperlipidemia Father    Healthy Brother    Healthy Daughter    Healthy Daughter    Diabetes Maternal Grandmother    Osteoporosis Maternal Grandmother    Arthritis Maternal Grandmother    Asthma Maternal Grandmother    COPD Maternal Grandmother    Hyperlipidemia Maternal Grandmother    Hypertension Maternal Grandmother    Heart disease  Maternal Grandfather    Hyperlipidemia Maternal Grandfather    Cancer Maternal Grandfather        PANCREATIC/ BONE   Cancer Paternal Grandfather        THROAT   Diabetes Paternal Grandfather    Colon cancer Neg Hx    Colon polyps Neg Hx     Social History   Socioeconomic History   Marital status: Married    Spouse name: Not on file   Number of children: 2   Years of education: 12   Highest education level: High school graduate  Occupational History   Occupation: works from home     Comment: [employer]  Tobacco Use   Smoking status: Former    Types: Cigarettes    Quit date: 01/24/1998    Years since quitting: 24.4   Smokeless tobacco: Never  Vaping Use   Vaping Use: Never used  Substance and Sexual Activity   Alcohol use: Not Currently   Drug use: No   Sexual activity: Yes    Birth control/protection: I.U.D.  Other Topics Concern   Not on file  Social History Narrative   Lives at home with husband and children.   Right-handed.   2 sodas per day.   Social Determinants of Health   Financial Resource Strain: Not on file  Food Insecurity: Not on file  Transportation Needs: Not on file  Physical Activity: Not on file  Stress: Not on file  Social Connections: Not on file  Intimate Partner Violence: Not on file    Review of Systems  Respiratory:  Positive for shortness of breath and wheezing.     Vitals:   06/29/22 1530  BP: 112/80  Pulse: 76  SpO2: 100%     Physical Exam Constitutional:      Appearance: Normal appearance.  HENT:     Head: Normocephalic.     Mouth/Throat:     Mouth: Mucous membranes are moist.  Eyes:     General: No scleral icterus.    Pupils: Pupils are equal, round, and reactive to light.  Cardiovascular:     Rate and Rhythm: Normal rate and regular rhythm.     Heart sounds: No murmur heard.    No friction rub.  Pulmonary:     Effort: No respiratory distress.     Breath sounds: No stridor. No wheezing or  rhonchi.  Musculoskeletal:     Cervical back: No rigidity or tenderness.  Neurological:     Mental Status: She is alert.  Psychiatric:        Mood and Affect: Mood normal.    Data Reviewed: No recent radiological data  Assessment:  Airway hyperresponsiveness  Wheezing, shortness of breath    Plan/Recommendations: Increase dose of fluticasone to 100-prescription for Arnuity 100 daily  Continue albuterol as needed  Inhaler technique was reviewed  Follow-up in about 8  weeks  Escalation to Arnuity 200 if needed or steroid with long-acting albuterol if needed  May consider pulmonary function test if worsening symptoms   Virl Diamond MD [facility] 06/29/2022, 3:38 PM  CC: Sheliah Hatch, MD

## 2022-06-29 NOTE — Patient Instructions (Addendum)
Will increase the dose of steroid from low-dose Flovent to middle range dose of Flovent  Generic Flovent is Arnuity, it comes and 100 and 200mg , we are using the 100 mg -It is 1 puff once a day  Can you print that again continue albuterol to be used up to 4 times a day if needed for shortness of breath and wheezing  Graded activities as tolerated  Tentative follow-up in 6 to 8 weeks  Call with significant concerns

## 2022-07-09 ENCOUNTER — Other Ambulatory Visit: Payer: Self-pay | Admitting: Pulmonary Disease

## 2022-07-09 ENCOUNTER — Encounter: Payer: Self-pay | Admitting: Pulmonary Disease

## 2022-07-09 MED ORDER — ARNUITY ELLIPTA 200 MCG/ACT IN AEPB: 1.0000 | INHALATION_SPRAY | Freq: Every day | RESPIRATORY_TRACT | 3 refills | Status: DC

## 2022-07-09 NOTE — Telephone Encounter (Signed)
Sent, sent patient a chart message as well

## 2022-07-09 NOTE — Progress Notes (Signed)
Changed to arnuity

## 2022-07-10 ENCOUNTER — Other Ambulatory Visit: Payer: Self-pay

## 2022-07-10 MED ORDER — ARNUITY ELLIPTA 200 MCG/ACT IN AEPB: 1.0000 | INHALATION_SPRAY | Freq: Every day | RESPIRATORY_TRACT | 3 refills | Status: DC

## 2022-07-21 NOTE — Telephone Encounter (Signed)
Patient sent this message about her Arnuity prescription.   Hello! I have been going back and forth with my pharmacy about this prescription. They did finally receive it, but the cost is $290 per month since I do not have prescription insurance. They tried to find coupons for it but were unable to. Is there a grant or a manufacturer's program that could get the cost down on this medication? Or another medication that I could get that would be at a sustainable price? I just can't afford to pay that much for a daily medication. In order to have a reasonable copay for medications, my monthly cost for insurance would increase another $600 for myself and my children and I just can't warrant that.  I would really appreciate any help you could offer for this.  Thank you!   Message routed to pharmacy team to run claim for possible cheaper insurance approved inhaler

## 2022-07-22 ENCOUNTER — Other Ambulatory Visit (HOSPITAL_COMMUNITY): Payer: Self-pay

## 2022-07-22 NOTE — Telephone Encounter (Signed)
Patient does not have pharmacy benefits coverage, therefore patient will have to pay out of pocket for any prescription medications.

## 2022-07-22 NOTE — Telephone Encounter (Signed)
Patient sent this message about her Arnuity prescription.     Hello! I have been going back and forth with my pharmacy about this prescription. They did finally receive it, but the cost is $290 per month since I do not have prescription insurance. They tried to find coupons for it but were unable to. Is there a grant or a manufacturer's program that could get the cost down on this medication? Or another medication that I could get that would be at a sustainable price? I just can't afford to pay that much for a daily medication. In order to have a reasonable copay for medications, my monthly cost for insurance would increase another $600 for myself and my children and I just can't warrant that.  I would really appreciate any help you could offer for this.  Thank you!    Message routed to Dr.Olalere to advise

## 2022-07-23 ENCOUNTER — Other Ambulatory Visit: Payer: Self-pay | Admitting: Pulmonary Disease

## 2022-07-23 MED ORDER — BUDESONIDE-FORMOTEROL FUMARATE 160-4.5 MCG/ACT IN AERO: 2.0000 | INHALATION_SPRAY | Freq: Two times a day (BID) | RESPIRATORY_TRACT | 5 refills | Status: AC

## 2022-07-23 NOTE — Telephone Encounter (Signed)
Sent in prescription for Symbicort in place of Arnuity.  There are so many inhaler choices, difficult for me to know what is covered for her or what the cost will be.   We need a steroid inhaler, if we have to combine with long-acting albuterol to get the cost affordable, should be okay.  I am open to prescribing anything that is most affordable to her

## 2022-10-13 ENCOUNTER — Other Ambulatory Visit: Payer: Self-pay | Admitting: Family Medicine

## 2023-01-25 ENCOUNTER — Encounter (INDEPENDENT_AMBULATORY_CARE_PROVIDER_SITE_OTHER): Payer: Self-pay | Admitting: Family Medicine

## 2023-01-25 DIAGNOSIS — K649 Unspecified hemorrhoids: Secondary | ICD-10-CM

## 2023-01-26 MED ORDER — HYDROCORTISONE ACETATE 25 MG RE SUPP: 25.0000 mg | Freq: Two times a day (BID) | RECTAL | 0 refills | Status: DC

## 2023-01-26 NOTE — Telephone Encounter (Signed)
[facility] VISIT   Patient agreed to [facility] visit and is aware that copayment and coinsurance may apply. Patient was treated using telemedicine according to accepted telemedicine protocols.  Subjective:   Patient complains of hemorrhoids  Patient Active Problem List   Diagnosis Date Noted   GERD (gastroesophageal reflux disease) 04/14/2021   Special screening for malignant neoplasms, colon    Hemorrhoids without complication    Physical exam 01/24/2018   Word finding difficulty 10/21/2017   Visual field constriction 10/21/2017   Memory loss 10/21/2017   Chronic cholecystitis    Dyspepsia    Abdominal pain 08/09/2016   Social History   Tobacco Use   Smoking status: Former    Current packs/day: 0.00    Types: Cigarettes    Quit date: 01/24/1998    Years since quitting: 25.0   Smokeless tobacco: Never  Substance Use Topics   Alcohol use: Not Currently    Current Outpatient Medications:    hydrocortisone (ANUSOL-HC) 25 MG suppository, Place 1 suppository (25 mg total) rectally 2 (two) times daily., Disp: 12 suppository, Rfl: 0   albuterol (VENTOLIN HFA) 108 (90 Base) MCG/ACT inhaler, Inhale 2 puffs into the lungs every 6 (six) hours as needed for wheezing or shortness of breath., Disp: 8 g, Rfl: 3   budesonide-formoterol (SYMBICORT) 160-4.5 MCG/ACT inhaler, Inhale 2 puffs into the lungs 2 times daily at 12 noon and 4 pm., Disp: 1 each, Rfl: 5   Cholecalciferol (VITAMIN D3) 125 MCG (5000 UT) TABS, Take 5,000 Units by mouth daily., Disp: , Rfl:    dibucaine (NUPERCAINAL) 1 % OINT, place 1 application RECTALLY AS NEEDED FOR hemorrhoids, Disp: 56 g, Rfl: 1   Digestive Enzymes (DIGESTIVE ENZYME PO), Take 1 capsule by mouth daily as needed (dairy based foods (upset stomach))., Disp: , Rfl:    Levonorgestrel (MIRENA IU), 1 each by Intrauterine route once. , Disp: , Rfl:    Multiple Vitamin (MULTIVITAMIN WITH MINERALS) TABS tablet, Take 1 tablet by mouth daily., Disp: , Rfl:     omeprazole (PRILOSEC) 40 MG capsule, TAKE ONE CAPSULE BY MOUTH EVERY DAY, Disp: 90 capsule, Rfl: 1   polyethylene glycol (MIRALAX / GLYCOLAX) 17 g packet, Take 17 g by mouth daily., Disp: , Rfl:    Probiotic Product (PROBIOTIC PO), Take 1 tablet by mouth daily., Disp: , Rfl:   Allergies  Allergen Reactions   Dilaudid [Hydromorphone] Other (See Comments)    Hallucinations    Rhinocort [Budesonide] Palpitations and Other (See Comments)    Heart palpitations    Assessment and Plan:   Diagnosis: Hemmorhoids. Please see myChart communication and orders below.   No orders of the defined types were placed in this encounter.  Meds ordered this encounter  Medications   hydrocortisone (ANUSOL-HC) 25 MG suppository    Sig: Place 1 suppository (25 mg total) rectally 2 (two) times daily.    Dispense:  12 suppository    Refill:  0    Neena Rhymes, MD 01/26/2023  A total of 8 minutes were spent by me to personally review the patient-generated inquiry, review patient records and data pertinent to assessment of the patient's problem, develop a management plan including generation of prescriptions and/or orders, and on subsequent communication with the patient through secure the MyChart portal service.   There is no separately reported E/M service related to this service in the past 7 days nor does the patient have an upcoming soonest available appointment for this issue. This work was completed in less than  7 days.   The patient consented to this service today (see patient agreement prior to ongoing communication). Patient counseled regarding the need for in-person exam for certain conditions and was advised to call the office if any changing or worsening symptoms occur.   The codes to be used for the E/M service are: [x]   99421 for 5-10 minutes of time spent on the inquiry. []   [id number] for 11-20 minutes. []   [id number] for 21+ minutes.

## 2023-01-26 NOTE — Addendum Note (Signed)
Addended by: Sheliah Hatch on: 01/26/2023 09:52 AM   Modules accepted: Orders

## 2023-03-16 ENCOUNTER — Other Ambulatory Visit: Payer: Self-pay | Admitting: Family Medicine

## 2023-04-13 ENCOUNTER — Encounter: Payer: Self-pay | Admitting: Family Medicine

## 2023-04-13 ENCOUNTER — Ambulatory Visit: Payer: Self-pay | Admitting: Family Medicine

## 2023-04-13 VITALS — BP 112/64 | HR 76 | Temp 97.9°F | Ht 68.0 in | Wt 188.2 lb

## 2023-04-13 DIAGNOSIS — K625 Hemorrhage of anus and rectum: Secondary | ICD-10-CM

## 2023-04-13 DIAGNOSIS — R590 Localized enlarged lymph nodes: Secondary | ICD-10-CM

## 2023-04-13 LAB — CBC WITH DIFFERENTIAL/PLATELET
Basophils Absolute: 0 10*3/uL (ref 0.0–0.1)
Basophils Relative: 0.7 % (ref 0.0–3.0)
Eosinophils Absolute: 0 10*3/uL (ref 0.0–0.7)
Eosinophils Relative: 0.5 % (ref 0.0–5.0)
HCT: 30.8 % — ABNORMAL LOW (ref 36.0–46.0)
Hemoglobin: 10.1 g/dL — ABNORMAL LOW (ref 12.0–15.0)
Lymphocytes Relative: 33.2 % (ref 12.0–46.0)
Lymphs Abs: 1.7 10*3/uL (ref 0.7–4.0)
MCHC: 32.9 g/dL (ref 30.0–36.0)
MCV: 90.1 fl (ref 78.0–100.0)
Monocytes Absolute: 0.5 10*3/uL (ref 0.1–1.0)
Monocytes Relative: 9.9 % (ref 3.0–12.0)
Neutro Abs: 2.8 10*3/uL (ref 1.4–7.7)
Neutrophils Relative %: 55.7 % (ref 43.0–77.0)
Platelets: 462 10*3/uL — ABNORMAL HIGH (ref 150.0–400.0)
RBC: 3.42 Mil/uL — ABNORMAL LOW (ref 3.87–5.11)
RDW: 15.8 % — ABNORMAL HIGH (ref 11.5–15.5)
WBC: 5 10*3/uL (ref 4.0–10.5)

## 2023-04-13 MED ORDER — HYDROCORTISONE ACETATE 25 MG RE SUPP: 25.0000 mg | Freq: Two times a day (BID) | RECTAL | 0 refills | Status: DC

## 2023-04-13 NOTE — Progress Notes (Signed)
   Subjective:    Patient ID: Angela Smith, female    DOB: 1975-07-06, 48 y.o.   MRN: [id number]  HPI Axillary mass- developed under L armpit after flu like illness ~1 month ago.  Initially was not bothersome but large.  Starting 5 days ago, area became TTP.  No drainage.  Area is now considerably smaller than when it started but remains painful.    BRBPR- pt had known hemorrhoids seen on colonoscopy 2022 (Dr Franky Macho).  Pt reports that she is having excessive bleeding w/ each BM since getting sick 1 month ago.  'it sounds like I'm peeing'.  Bleeding is painless.     Review of Systems For ROS see HPI     Objective:   Physical Exam Vitals reviewed. Exam conducted with a chaperone present.  Constitutional:      General: She is not in acute distress.    Appearance: Normal appearance. She is not ill-appearing.  HENT:     Head: Normocephalic and atraumatic.  Cardiovascular:     Rate and Rhythm: Normal rate and regular rhythm.  Pulmonary:     Effort: Pulmonary effort is normal. No respiratory distress.  Genitourinary:    Rectum: External hemorrhoid present.  Lymphadenopathy:     Cervical: No cervical adenopathy.     Upper Body:     Right upper body: No supraclavicular adenopathy.     Left upper body: Pectoral adenopathy (~1 cm, freely mobile, minimally TTP) present. No supraclavicular adenopathy.  Neurological:     General: No focal deficit present.     Mental Status: She is alert and oriented to person, place, and time.  Psychiatric:        Mood and Affect: Mood normal.        Behavior: Behavior normal.        Thought Content: Thought content normal.           Assessment & Plan:  Axillary LAD- new.  Pt reports area was much bigger in size a few weeks ago when she was dealing w/ a viral illness.  Area became painful but is now shrinking in size and is less tender than last week.  Suspect this is reactive LAD due to recent illness.  No evidence of boil or abscess.  No  need for abx as sxs are improving.  Pt expressed understanding and is in agreement w/ plan.   BRBPR- deteriorated.  Pt reports that since her recent flu like illness she has had bleeding w/ each BM.  Blood is bright red, fills the bowl- 'like I'm peeing'.  Bleeding is painless.  She has had episodic bleeding since colonoscopy 2 yrs ago.  At that time she had hemorrhoids on exam.  Sxs now sound like diverticular bleed.  Will get CBC and refer back to GI for urgent evaluation given ongoing blood loss.  Discussed that if bleeding is profuse and doesn't stop she needs to go to ER.  Pt expressed understanding and is in agreement w/ plan.

## 2023-04-13 NOTE — Patient Instructions (Signed)
 Follow up as needed or as scheduled We'll notify you of your lab results and make any changes if needed Warm compresses to the armpit to help w/ inflammation USE the suppositories for the hemorrhoids We'll call you to schedule your GI appt Call with any questions or concerns Hang in there!!!

## 2023-04-14 ENCOUNTER — Encounter: Payer: Self-pay | Admitting: Family Medicine

## 2023-04-15 ENCOUNTER — Encounter: Payer: Self-pay | Admitting: *Deleted

## 2023-04-15 ENCOUNTER — Other Ambulatory Visit: Payer: Self-pay | Admitting: Family Medicine

## 2023-04-20 NOTE — Progress Notes (Unsigned)
 Referring Provider:*** Primary Care Physician:  Sheliah Hatch, MD Primary Gastroenterologist:  Dr. Darrick Penna previously.  Establishing with Dr. Marletta Lor.  No chief complaint on file.   HPI:   Angela Smith is a 48 y.o. female with GI history of perforated appendicitis s/p appendectomy in 2018, chronic abdominal pain/dyspepsia that resolved following cholecystectomy 2019, presenting today with chief complaint of rectal bleeding ***     Colonoscopy by Dr. Lovell Sheehan 04/02/2020: Hemorrhoids on perianal exam, otherwise normal exam.  Recommended 10-year screening.  Reviewed recent labs 3//25 that showed hemoglobin of 10.1.  No history of anemia per chart review.  Past Medical History:  Diagnosis Date   Allergy    ALLERGIC RHINITS   Fibrocystic breast disease    Hyperlipidemia    DIET/ HERBAL CONTROLED   Memory difficulty    Miscarriage 2006   TWO SEPERATE 4 MONTHS APART   Seizures (HCC)    INFANTILE; none since infancy. On no meds and no more seizures.    Past Surgical History:  Procedure Laterality Date   APPENDECTOMY  2018   CHOLECYSTECTOMY N/A 06/04/2017   Procedure: LAPAROSCOPIC CHOLECYSTECTOMY;  Surgeon: Franky Macho, MD;  Location: [facility];  Service: General;  Laterality: N/A;   COLONOSCOPY N/A 04/02/2020   Procedure: COLONOSCOPY;  Surgeon: Franky Macho, MD;  Location: [facility];  Service: General;  Laterality: N/A;   DILATION AND CURETTAGE OF UTERUS  2006   MISCARRIAGE   ESOPHAGOGASTRODUODENOSCOPY N/A 01/11/2017   mild gastritis, normal small bowel   KNEE ARTHROSCOPY Left    X 2   LAPAROSCOPIC APPENDECTOMY N/A 08/10/2016   Procedure: APPENDECTOMY LAPAROSCOPIC;  Surgeon: Franky Macho, MD;  Location: [facility];  Service: General;  Laterality: N/A;    Current Outpatient Medications  Medication Sig Dispense Refill   albuterol (VENTOLIN HFA) 108 (90 Base) MCG/ACT inhaler INHALE TWO PUFFS BY MOUTH EVERY 6 HOURS AS NEEDED FOR wheezing OR SHORTNESS OF BREATH 8.5 g 3    budesonide-formoterol (SYMBICORT) 160-4.5 MCG/ACT inhaler Inhale 2 puffs into the lungs 2 times daily at 12 noon and 4 pm. 1 each 5   Cholecalciferol (VITAMIN D3) 125 MCG (5000 UT) TABS Take 5,000 Units by mouth daily.     dibucaine (NUPERCAINAL) 1 % OINT place 1 application RECTALLY AS NEEDED FOR hemorrhoids 56 g 1   Digestive Enzymes (DIGESTIVE ENZYME PO) Take 1 capsule by mouth daily as needed (dairy based foods (upset stomach)).     hydrocortisone (ANUSOL-HC) 25 MG suppository Place 1 suppository (25 mg total) rectally 2 (two) times daily. 12 suppository 0   Levonorgestrel (MIRENA IU) 1 each by Intrauterine route once.      Multiple Vitamin (MULTIVITAMIN WITH MINERALS) TABS tablet Take 1 tablet by mouth daily.     omeprazole (PRILOSEC) 40 MG capsule TAKE ONE CAPSULE BY MOUTH EVERY DAY 90 capsule 1   polyethylene glycol (MIRALAX / GLYCOLAX) 17 g packet Take 17 g by mouth daily.     Probiotic Product (PROBIOTIC PO) Take 1 tablet by mouth daily.     No current facility-administered medications for this visit.    Allergies as of 04/22/2023 - Review Complete 04/13/2023  Allergen Reaction Noted   Dilaudid [hydromorphone] Other (See Comments) 08/10/2016   Rhinocort [budesonide] Palpitations and Other (See Comments) 11/29/2012    Family History  Problem Relation Age of Onset   Depression Mother    Hypertension Father    Hyperlipidemia Father    Healthy Brother    Healthy Daughter  Healthy Daughter    Diabetes Maternal Grandmother    Osteoporosis Maternal Grandmother    Arthritis Maternal Grandmother    Asthma Maternal Grandmother    COPD Maternal Grandmother    Hyperlipidemia Maternal Grandmother    Hypertension Maternal Grandmother    Heart disease Maternal Grandfather    Hyperlipidemia Maternal Grandfather    Cancer Maternal Grandfather        PANCREATIC/ BONE   Cancer Paternal Grandfather        THROAT   Diabetes Paternal Grandfather    Colon cancer Neg Hx    Colon polyps  Neg Hx     Social History   Socioeconomic History   Marital status: Married    Spouse name: Not on file   Number of children: 2   Years of education: 12   Highest education level: High school graduate  Occupational History   Occupation: works from home     Comment: [employer]  Tobacco Use   Smoking status: Former    Current packs/day: 0.00    Types: Cigarettes    Quit date: 01/24/1998    Years since quitting: 25.2   Smokeless tobacco: Never  Vaping Use   Vaping status: Never Used  Substance and Sexual Activity   Alcohol use: Not Currently   Drug use: No   Sexual activity: Yes    Birth control/protection: I.U.D.  Other Topics Concern   Not on file  Social History Narrative   Lives at home with husband and children.   Right-handed.   2 sodas per day.   Social Drivers of Corporate investment banker Strain: Not on file  Food Insecurity: Not on file  Transportation Needs: Not on file  Physical Activity: Not on file  Stress: Not on file  Social Connections: Not on file  Intimate Partner Violence: Not on file    Review of Systems: Gen: Denies any fever, chills, fatigue, weight loss, lack of appetite.  CV: Denies chest pain, heart palpitations, peripheral edema, syncope.  Resp: Denies shortness of breath at rest or with exertion. Denies wheezing or cough.  GI: Denies dysphagia or odynophagia. Denies jaundice, hematemesis, fecal incontinence. GU : Denies urinary burning, urinary frequency, urinary hesitancy MS: Denies joint pain, muscle weakness, cramps, or limitation of movement.  Derm: Denies rash, itching, dry skin Psych: Denies depression, anxiety, memory loss, and confusion Heme: Denies bruising, bleeding, and enlarged lymph nodes.  Physical Exam: There were no vitals taken for this visit. General:   Alert and oriented. Pleasant and cooperative. Well-nourished and well-developed.  Head:  Normocephalic and atraumatic. Eyes:  Without icterus,  sclera clear and conjunctiva pink.  Ears:  Normal auditory acuity. Lungs:  Clear to auscultation bilaterally. No wheezes, rales, or rhonchi. No distress.  Heart:  S1, S2 present without murmurs appreciated.  Abdomen:  +BS, soft, non-tender and non-distended. No HSM noted. No guarding or rebound. No masses appreciated.  Rectal:  Deferred  Msk:  Symmetrical without gross deformities. Normal posture. Extremities:  Without edema. Neurologic:  Alert and  oriented x4;  grossly normal neurologically. Skin:  Intact without significant lesions or rashes. Psych:  Alert and cooperative. Normal mood and affect.    Assessment:     Plan:  ***   Ermalinda Memos, PA-C [facility] 04/22/2023

## 2023-04-22 ENCOUNTER — Telehealth: Payer: Self-pay | Admitting: *Deleted

## 2023-04-22 ENCOUNTER — Encounter: Payer: Self-pay | Admitting: *Deleted

## 2023-04-22 ENCOUNTER — Other Ambulatory Visit: Payer: Self-pay | Admitting: *Deleted

## 2023-04-22 ENCOUNTER — Encounter: Payer: Self-pay | Admitting: Gastroenterology

## 2023-04-22 ENCOUNTER — Ambulatory Visit (INDEPENDENT_AMBULATORY_CARE_PROVIDER_SITE_OTHER): Payer: Self-pay | Admitting: Gastroenterology

## 2023-04-22 VITALS — BP 117/80 | HR 83 | Temp 97.8°F | Ht 68.0 in | Wt 192.2 lb

## 2023-04-22 DIAGNOSIS — K625 Hemorrhage of anus and rectum: Secondary | ICD-10-CM

## 2023-04-22 DIAGNOSIS — D649 Anemia, unspecified: Secondary | ICD-10-CM

## 2023-04-22 DIAGNOSIS — K649 Unspecified hemorrhoids: Secondary | ICD-10-CM

## 2023-04-22 MED ORDER — PEG 3350-KCL-NA BICARB-NACL 420 G PO SOLR: 4000.0000 mL | Freq: Once | ORAL | 0 refills | Status: AC

## 2023-04-22 NOTE — Telephone Encounter (Signed)
 LMOVM to call back  TCS w/Dr.Carver, asa 2 Hold Iron x 7 days UPT

## 2023-04-22 NOTE — Patient Instructions (Signed)
 We will get you scheduled for colonoscopy in the near future with Dr. Marletta Lor. You will need to hold iron for 7 days prior to procedure.  You may continue to use the rectal suppositories your primary care provider prescribed for you.  Continue MiraLAX daily.  Limit toilet time to 2-3 minutes.  Avoid straining.  I will see back in the office after your procedure.  Ermalinda Memos, PA-C [facility]

## 2023-04-22 NOTE — Addendum Note (Signed)
 Addended by: Elinor Dodge on: 04/22/2023 11:11 AM   Modules accepted: Orders

## 2023-05-11 ENCOUNTER — Encounter (HOSPITAL_COMMUNITY): Admission: RE | Payer: Self-pay | Source: Home / Self Care

## 2023-05-11 ENCOUNTER — Ambulatory Visit (HOSPITAL_COMMUNITY)
Admission: RE | Admit: 2023-05-11 | Payer: PRIVATE HEALTH INSURANCE | Source: Home / Self Care | Admitting: Internal Medicine

## 2023-05-11 SURGERY — COLONOSCOPY
Anesthesia: Choice

## 2023-05-14 ENCOUNTER — Other Ambulatory Visit: Payer: Self-pay | Admitting: Gastroenterology

## 2023-05-14 NOTE — Telephone Encounter (Signed)
 Noted.

## 2023-05-14 NOTE — Progress Notes (Signed)
 Error

## 2023-06-01 ENCOUNTER — Encounter: Payer: Self-pay | Admitting: Family Medicine

## 2023-09-09 ENCOUNTER — Other Ambulatory Visit: Payer: Self-pay | Admitting: Family Medicine

## 2023-09-09 NOTE — Telephone Encounter (Signed)
 Unsure if I can fill rx for patient.  Requested Prescriptions   Pending Prescriptions Disp Refills   hydrocortisone  (ANUSOL -HC) 25 MG suppository [Pharmacy Med Name: hydrocortisone  acetate 25 mg rectal suppository] 12 suppository 0    Sig: INSERT ONE SUPPOSITORY RECTALLY TWICE DAILY     Date of patient request: 09/09/23 Last office visit: 04/13/2023 Upcoming visit: Visit date not found

## 2023-10-12 ENCOUNTER — Other Ambulatory Visit: Payer: Self-pay | Admitting: Family Medicine

## 2023-10-29 ENCOUNTER — Other Ambulatory Visit: Payer: Self-pay | Admitting: Family Medicine
